# Patient Record
Sex: Female | Born: 2000 | Race: Black or African American | Hispanic: No | Marital: Single | Smoking: Never smoker
Health system: Southern US, Community
[De-identification: ages and names within clinical notes are randomized; demographics above are authoritative.]

## PROBLEM LIST (undated history)

## (undated) ENCOUNTER — Ambulatory Visit (HOSPITAL_COMMUNITY): Admission: EM

## (undated) DIAGNOSIS — K5900 Constipation, unspecified: Secondary | ICD-10-CM

## (undated) DIAGNOSIS — Z789 Other specified health status: Secondary | ICD-10-CM

## (undated) HISTORY — PX: HERNIA REPAIR: SHX51

## (undated) HISTORY — DX: Other specified health status: Z78.9

## (undated) HISTORY — DX: Constipation, unspecified: K59.00

---
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
## 2019-03-11 ENCOUNTER — Encounter (HOSPITAL_COMMUNITY): Payer: Self-pay | Admitting: *Deleted

## 2019-03-11 ENCOUNTER — Other Ambulatory Visit: Payer: Self-pay

## 2019-03-11 ENCOUNTER — Emergency Department (HOSPITAL_COMMUNITY)
Admission: EM | Admit: 2019-03-11 | Discharge: 2019-03-11 | Disposition: A | Discharge status: Home / Self Care | Payer: Self-pay | Attending: Emergency Medicine | Admitting: Emergency Medicine

## 2019-03-11 DIAGNOSIS — K0889 Other specified disorders of teeth and supporting structures: Secondary | ICD-10-CM | POA: Insufficient documentation

## 2019-03-11 MED ORDER — PENICILLIN V POTASSIUM 500 MG PO TABS: 1000.0000 mg | ORAL_TABLET | Freq: Two times a day (BID) | ORAL | 0 refills | Status: DC

## 2019-03-11 MED ORDER — HYDROCODONE-ACETAMINOPHEN 5-325 MG PO TABS
2.0000 | ORAL_TABLET | Freq: Once | ORAL | Status: AC
  Administered 2019-03-11: 03:00:00 2 via ORAL
  Filled 2019-03-11: qty 2

## 2019-03-11 MED ORDER — PENICILLIN V POTASSIUM 500 MG PO TABS
500.0000 mg | ORAL_TABLET | Freq: Once | ORAL | Status: AC
  Administered 2019-03-11: 500 mg via ORAL
  Filled 2019-03-11: qty 1

## 2019-03-11 NOTE — ED Provider Notes (Signed)
[facility] DEPT Provider Note   CSN: [id number] Arrival date & time: 03/11/19  0058     History   Chief Complaint Chief Complaint  Patient presents with  . Dental Pain    HPI Natasha Cooper is a 18 y.o. female with a hx of no major medical problems presents to the Emergency Department complaining of gradual, persistent, progressively worsening right upper dental pain onset this evening around 10 PM.  Patient reports her right upper molar did have a filling however it came out about 2 months ago.  She reports some intermittent pain to the site over the last month however it became constant and intense tonight.  She denies fevers or chills, headache, neck pain, neck stiffness, nausea or vomiting.  Patient denies obvious dental abscess, difficulty swallowing, changes in voice.  No treatments prior to arrival.  Nothing specific seems to make the symptoms better or worse.  Patient reports she has not seen a dentist in many years.     The history is provided by the patient and medical records. No language interpreter was used.    Past Medical History:  Diagnosis Date  . Constipation     Patient Active Problem List   Diagnosis Date Noted  . Constipation 03/02/2013  . Vision changes 03/02/2013    Past Surgical History:  Procedure Laterality Date  . HERNIA REPAIR       OB History   No obstetric history on file.      Home Medications    Prior to Admission medications   Medication Sig Start Date End Date Taking? Authorizing Provider  HYDROcodone-acetaminophen (HYCET) 7.5-325 mg/15 ml solution Take 10 mLs by mouth every 8 (eight) hours as needed for severe pain. 07/16/15   Antonietta Breach, PA-C  ibuprofen (CHILDRENS IBUPROFEN) 100 MG/5ML suspension Take 20 mLs (400 mg total) by mouth every 6 (six) hours as needed for mild pain or moderate pain. 07/16/15   Antonietta Breach, PA-C  ondansetron (ZOFRAN ODT) 4 MG disintegrating tablet Take 1 tablet (4 mg  total) by mouth every 8 (eight) hours as needed for nausea or vomiting. 02/16/17   Ward, Ozella Almond, PA-C  penicillin v potassium (VEETID) 500 MG tablet Take 2 tablets (1,000 mg total) by mouth 2 (two) times daily. X 7 days 03/11/19   Audrey Eller, Jarrett Soho, PA-C    Family History Family History  Problem Relation Age of Onset  . Multiple sclerosis Father   . Asthma Brother   . Cancer Paternal Grandmother   . Arthritis Maternal Grandmother   . Diabetes Maternal Grandmother     Social History Social History   Tobacco Use  . Smoking status: Never Smoker  . Smokeless tobacco: Never Used  Substance Use Topics  . Alcohol use: No    Alcohol/week: 0.0 standard drinks  . Drug use: No     Allergies   Patient has no known allergies.   Review of Systems Review of Systems  Constitutional: Negative for appetite change, chills and fever.  HENT: Positive for dental problem. Negative for drooling, ear pain, facial swelling, nosebleeds, postnasal drip, rhinorrhea and trouble swallowing.   Eyes: Negative for pain and redness.  Respiratory: Negative for cough and wheezing.   Cardiovascular: Negative for chest pain.  Gastrointestinal: Negative for abdominal pain, nausea and vomiting.  Musculoskeletal: Negative for neck pain and neck stiffness.  Skin: Negative for color change and rash.  Neurological: Negative for weakness, light-headedness and headaches.  All other systems reviewed and are negative.  Physical Exam Updated Vital Signs BP 123/88 (BP Location: Right Arm)   Pulse 81   Temp 98.7 F (37.1 C) (Oral)   Resp 18   Ht 5\' 4"  (1.626 m)   Wt 70.3 kg   LMP 03/09/2019 (Exact Date)   SpO2 100%   BMI 26.61 kg/m   Physical Exam Vitals signs and nursing note reviewed.  Constitutional:      Appearance: She is well-developed.  HENT:     Head: Normocephalic.     Right Ear: Tympanic membrane, ear canal and external ear normal.     Left Ear: Tympanic membrane, ear canal and  external ear normal.     Nose: Nose normal.     Right Sinus: No maxillary sinus tenderness or frontal sinus tenderness.     Left Sinus: No maxillary sinus tenderness or frontal sinus tenderness.     Mouth/Throat:     Mouth: No lacerations or oral lesions.     Dentition: Abnormal dentition. Dental caries present.     Pharynx: Uvula midline. No oropharyngeal exudate, posterior oropharyngeal erythema or uvula swelling.     Tonsils: No tonsillar abscesses.      Comments: Sublingual tissue without induration or pain. Eyes:     General:        Right eye: No discharge.        Left eye: No discharge.     Conjunctiva/sclera: Conjunctivae normal.  Neck:     Musculoskeletal: Normal range of motion and neck supple.     Comments: No stridor Handling secretions without difficulty No nuchal rigidity No cervical lymphadenopathy Cardiovascular:     Rate and Rhythm: Normal rate and regular rhythm.  Pulmonary:     Effort: Pulmonary effort is normal. No respiratory distress.  Abdominal:     General: There is no distension.  Musculoskeletal: Normal range of motion.  Lymphadenopathy:     Cervical: No cervical adenopathy.  Skin:    General: Skin is warm and dry.  Neurological:     Mental Status: She is alert.  Psychiatric:        Mood and Affect: Mood normal.      ED Treatments / Results   Procedures Procedures (including critical care time)  Medications Ordered in ED Medications  HYDROcodone-acetaminophen (NORCO/VICODIN) 5-325 MG per tablet 2 tablet (has no administration in time range)  penicillin v potassium (VEETID) tablet 500 mg (has no administration in time range)     Initial Impression / Assessment and Plan / ED Course  I have reviewed the triage vital signs and the nursing notes.  Pertinent labs & imaging results that were available during my care of the patient were reviewed by me and considered in my medical decision making (see chart for details).        Patient  with toothache.  No gross abscess.  Exam unconcerning for Ludwig's angina or spread of infection.  Will treat with penicillin and anti-inflammatories medicine.  Urged patient to follow-up with dentist.     Final Clinical Impressions(s) / ED Diagnoses   Final diagnoses:  Pain, dental    ED Discharge Orders         Ordered    penicillin v potassium (VEETID) 500 MG tablet  2 times daily,   Status:  Discontinued     03/11/19 0259    penicillin v potassium (VEETID) 500 MG tablet  2 times daily     03/11/19 0302           Maryruth Apple, Dahlia Client,  PA-C 03/11/19 0302    Nira Connardama, Pedro Eduardo, MD 03/11/19 [phone]

## 2019-03-11 NOTE — Discharge Instructions (Addendum)
1. Medications: Alternate Tylenol and ibuprofen (You may take 1000 mg of Tylenol up to 4 times per day, do not exceed 4 g in 24 hours.  You may take 800 mg of ibuprofen up to 3 times per day.) penicillin, usual home medications 2. Treatment: rest, drink plenty of fluids, take medications as prescribed 3. Follow Up: Please followup with dentistry within 1 week for discussion of your diagnoses and further evaluation after today's visit; if you do not have a primary care doctor use the resource guide provided to find one; Return to the ER for high fevers, difficulty breathing, difficulty swallowing or other concerning symptoms

## 2019-03-11 NOTE — ED Triage Notes (Addendum)
Pt reports right upper dental pain x 3-4 weeks.  Pt reports the pain got worse tonight.

## 2020-01-26 ENCOUNTER — Emergency Department (HOSPITAL_COMMUNITY)
Admission: EM | Admit: 2020-01-26 | Discharge: 2020-01-26 | Disposition: A | Discharge status: Home / Self Care | Payer: BC Managed Care – PPO | Attending: Emergency Medicine | Admitting: Emergency Medicine

## 2020-01-26 ENCOUNTER — Encounter (HOSPITAL_COMMUNITY): Payer: Self-pay | Admitting: Emergency Medicine

## 2020-01-26 DIAGNOSIS — R519 Headache, unspecified: Secondary | ICD-10-CM | POA: Diagnosis not present

## 2020-01-26 DIAGNOSIS — K0889 Other specified disorders of teeth and supporting structures: Secondary | ICD-10-CM | POA: Diagnosis present

## 2020-01-26 DIAGNOSIS — K0381 Cracked tooth: Secondary | ICD-10-CM | POA: Insufficient documentation

## 2020-01-26 MED ORDER — IBUPROFEN 800 MG PO TABS: 800.0000 mg | ORAL_TABLET | Freq: Three times a day (TID) | ORAL | 0 refills | Status: AC

## 2020-01-26 MED ORDER — PENICILLIN V POTASSIUM 500 MG PO TABS: 500.0000 mg | ORAL_TABLET | Freq: Four times a day (QID) | ORAL | 0 refills | Status: AC

## 2020-01-26 MED ORDER — CHLORHEXIDINE GLUCONATE 0.12 % MT SOLN: 15.0000 mL | Freq: Two times a day (BID) | OROMUCOSAL | 0 refills | Status: AC

## 2020-01-26 NOTE — ED Provider Notes (Signed)
[facility] EMERGENCY DEPARTMENT Provider Note   CSN: [id number] Arrival date & time: 01/26/20  0550     History Chief Complaint  Patient presents with   Dental Pain    Natasha Cooper is a 19 y.o. female here with dental pain.  Thinks she cracked her bottom right molar yesterday.  She's had throbbing pain all night.  No fevers or chills.  She has a Education officer, community but has not called yet today.  She reports a throbbing headache with this.  HPI     Past Medical History:  Diagnosis Date   Constipation     Patient Active Problem List   Diagnosis Date Noted   Constipation 03/02/2013   Vision changes 03/02/2013    Past Surgical History:  Procedure Laterality Date   HERNIA REPAIR       OB History   No obstetric history on file.     Family History  Problem Relation Age of Onset   Multiple sclerosis Father    Asthma Brother    Cancer Paternal Grandmother    Arthritis Maternal Grandmother    Diabetes Maternal Grandmother     Social History   Tobacco Use   Smoking status: Never Smoker   Smokeless tobacco: Never Used  Building services engineer Use: Never used  Substance Use Topics   Alcohol use: No    Alcohol/week: 0.0 standard drinks   Drug use: No    Home Medications Prior to Admission medications   Medication Sig Start Date End Date Taking? Authorizing Provider  chlorhexidine (PERIDEX) 0.12 % solution Use as directed 15 mLs in the mouth or throat 2 (two) times daily for 7 days. 01/26/20 02/02/20  Terald Sleeper, MD  HYDROcodone-acetaminophen (HYCET) 7.5-325 mg/15 ml solution Take 10 mLs by mouth every 8 (eight) hours as needed for severe pain. 07/16/15   Antony Madura, PA-C  ibuprofen (ADVIL) 800 MG tablet Take 1 tablet (800 mg total) by mouth 3 (three) times daily for 7 days. 01/26/20 02/02/20  Terald Sleeper, MD  ibuprofen (CHILDRENS IBUPROFEN) 100 MG/5ML suspension Take 20 mLs (400 mg total) by mouth every 6 (six) hours as  needed for mild pain or moderate pain. 07/16/15   Antony Madura, PA-C  ondansetron (ZOFRAN ODT) 4 MG disintegrating tablet Take 1 tablet (4 mg total) by mouth every 8 (eight) hours as needed for nausea or vomiting. 02/16/17   Ward, Chase Picket, PA-C  penicillin v potassium (VEETID) 500 MG tablet Take 2 tablets (1,000 mg total) by mouth 2 (two) times daily. X 7 days 03/11/19   Muthersbaugh, Dahlia Client, PA-C  penicillin v potassium (VEETID) 500 MG tablet Take 1 tablet (500 mg total) by mouth 4 (four) times daily for 7 days. 01/26/20 02/02/20  Terald Sleeper, MD    Allergies    Patient has no known allergies.  Review of Systems   Review of Systems  Constitutional: Negative for chills and fever.  HENT: Positive for dental problem. Negative for drooling.   Respiratory: Negative for cough and shortness of breath.   Cardiovascular: Negative for chest pain and palpitations.  Gastrointestinal: Negative for nausea and vomiting.  Musculoskeletal: Negative for arthralgias and back pain.  Skin: Negative for color change and rash.  Allergic/Immunologic: Negative for food allergies and immunocompromised state.  Neurological: Positive for headaches. Negative for syncope.  Psychiatric/Behavioral: Negative for agitation and confusion.  All other systems reviewed and are negative.   Physical Exam Updated Vital Signs BP 121/70 (BP Location:  Left Arm)    Pulse 96    Temp 99.1 F (37.3 C) (Oral)    Resp 16    SpO2 96%   Physical Exam Vitals and nursing note reviewed.  Constitutional:      General: She is not in acute distress.    Appearance: She is well-developed.  HENT:     Head: Normocephalic and atraumatic.     Comments: Cracked enamel tooth 32, no fluctuance or sign of abscess Eyes:     Conjunctiva/sclera: Conjunctivae normal.  Cardiovascular:     Rate and Rhythm: Normal rate and regular rhythm.     Pulses: Normal pulses.  Pulmonary:     Effort: Pulmonary effort is normal. No respiratory distress.    Musculoskeletal:     Cervical back: Neck supple.  Skin:    General: Skin is warm and dry.  Neurological:     Mental Status: She is alert.  Psychiatric:        Mood and Affect: Mood normal.        Behavior: Behavior normal.     ED Results / Procedures / Treatments   Labs (all labs ordered are listed, but only abnormal results are displayed) Labs Reviewed - No data to display  EKG None  Radiology No results found.  Procedures Procedures (including critical care time)  Medications Ordered in ED Medications - No data to display  ED Course  I have reviewed the triage vital signs and the nursing notes.  Pertinent labs & imaging results that were available during my care of the patient were reviewed by me and considered in my medical decision making (see chart for details).  19 yo female presenting with cracked tooth, bottom right molar, #32.  No evidence of abscess on exam.  She is well appearing.  Suspect her headache is related to this tooth ache.    We'll give penicillin to try to prevent pulpitis or further infection, peridex swish & spit, motrin, f/u with dental clinic.     Final Clinical Impression(s) / ED Diagnoses Final diagnoses:  Pain, dental  Cracked tooth    Rx / DC Orders ED Discharge Orders         Ordered    ibuprofen (ADVIL) 800 MG tablet  3 times daily     Discontinue  Reprint     01/26/20 0818    penicillin v potassium (VEETID) 500 MG tablet  4 times daily     Discontinue  Reprint     01/26/20 0818    chlorhexidine (PERIDEX) 0.12 % solution  2 times daily     Discontinue  Reprint     01/26/20 0818           Terald Sleeper, MD 01/26/20 1022

## 2020-01-26 NOTE — ED Triage Notes (Signed)
Pt endorses R lower dental pain that began yesterday. Also endorses headache, taking ibuprofen and tylenol without relief.

## 2020-01-26 NOTE — ED Notes (Signed)
Pt discharge instructions reviewed with the patient. The patient verbalized understanding of instructions. Pt discharged. 

## 2020-01-26 NOTE — Discharge Instructions (Addendum)
Call this number today for Dr Michaelle Copas office and tell them you were seen in the [facility] ER today.  Ask if they can see you in the office today for a cracked molar tooth.

## 2022-03-30 LAB — OB RESULTS CONSOLE HEPATITIS B SURFACE ANTIGEN
Hepatitis B Surface Ag: NEGATIVE
Hepatitis B Surface Ag: NEGATIVE

## 2022-03-30 LAB — OB RESULTS CONSOLE ANTIBODY SCREEN: Antibody Screen: NEGATIVE

## 2022-03-30 LAB — OB RESULTS CONSOLE ABO/RH: RH Type: POSITIVE

## 2022-03-30 LAB — OB RESULTS CONSOLE RPR
RPR: NONREACTIVE
RPR: NONREACTIVE

## 2022-03-30 LAB — OB RESULTS CONSOLE HIV ANTIBODY (ROUTINE TESTING)
HIV: NONREACTIVE
HIV: NONREACTIVE

## 2022-03-30 LAB — OB RESULTS CONSOLE RUBELLA ANTIBODY, IGM
Rubella: IMMUNE
Rubella: IMMUNE

## 2022-03-30 LAB — HEPATITIS C ANTIBODY: HCV Ab: NEGATIVE

## 2022-03-30 LAB — OB RESULTS CONSOLE GC/CHLAMYDIA
Chlamydia: NEGATIVE
Neisseria Gonorrhea: NEGATIVE

## 2022-05-12 ENCOUNTER — Other Ambulatory Visit: Payer: Self-pay | Admitting: Obstetrics

## 2022-05-12 ENCOUNTER — Other Ambulatory Visit: Payer: Self-pay

## 2022-05-12 DIAGNOSIS — Z363 Encounter for antenatal screening for malformations: Secondary | ICD-10-CM

## 2022-05-13 ENCOUNTER — Ambulatory Visit: Payer: BC Managed Care – PPO | Attending: Obstetrics | Admitting: Obstetrics

## 2022-05-13 ENCOUNTER — Ambulatory Visit: Payer: BC Managed Care – PPO | Attending: Obstetrics and Gynecology

## 2022-05-13 ENCOUNTER — Encounter: Payer: Self-pay | Admitting: *Deleted

## 2022-05-13 ENCOUNTER — Other Ambulatory Visit: Payer: Self-pay | Admitting: *Deleted

## 2022-05-13 ENCOUNTER — Ambulatory Visit: Payer: BC Managed Care – PPO | Admitting: *Deleted

## 2022-05-13 VITALS — BP 101/57 | HR 75

## 2022-05-13 DIAGNOSIS — O358XX0 Maternal care for other (suspected) fetal abnormality and damage, not applicable or unspecified: Secondary | ICD-10-CM | POA: Diagnosis not present

## 2022-05-13 DIAGNOSIS — O365920 Maternal care for other known or suspected poor fetal growth, second trimester, not applicable or unspecified: Secondary | ICD-10-CM | POA: Insufficient documentation

## 2022-05-13 DIAGNOSIS — Z3A19 19 weeks gestation of pregnancy: Secondary | ICD-10-CM

## 2022-05-13 DIAGNOSIS — Z363 Encounter for antenatal screening for malformations: Secondary | ICD-10-CM | POA: Diagnosis not present

## 2022-05-13 DIAGNOSIS — Z3492 Encounter for supervision of normal pregnancy, unspecified, second trimester: Secondary | ICD-10-CM

## 2022-05-13 DIAGNOSIS — O365910 Maternal care for other known or suspected poor fetal growth, first trimester, not applicable or unspecified: Secondary | ICD-10-CM

## 2022-05-13 NOTE — Progress Notes (Signed)
MFM Note  Natasha Cooper was seen for a detailed fetal anatomy scan due to possible IUGR noted on an ultrasound performed in your office yesterday.  The patient reports that she had a first trimester ultrasound performed in your office that showed an [facility] of October 04, 2022.  She denies any significant past medical history and denies any problems in her current pregnancy.    She had a cell free DNA test earlier in her pregnancy which indicated a low risk for trisomy 37, 5, and 13. A female fetus is predicted.   Using an [facility] of October 04, 2022, the overall EFW obtained today with measures at the 11th percentile for her gestational age.  We changed her EDC to October 04, 2022 today.  There was normal amniotic fluid noted.  Doppler studies of the umbilical arteries performed today continues to show normal forward flow.  There were no signs of absent or reversed end-diastolic flow noted today.  On today's exam, an intracardiac echogenic focus was noted in the left ventricle of the fetal heart.    The small association between an echogenic focus and Down syndrome was discussed.   The patient was advised that the smaller sized fetus along with the echogenic focus noted today may increase her risk of Down syndrome.  She was offered and declined an amniocentesis today for definitive diagnosis of fetal aneuploidy.  She reports that she is comfortable with her negative cell free DNA test.  The patient was informed that anomalies may be missed due to technical limitations. If the fetus is in a suboptimal position or maternal habitus is increased, visualization of the fetus in the maternal uterus may be impaired.  A follow-up exam was scheduled in 3 weeks to assess the fetal growth.    The patient stated that all of her questions were answered today.  A total of 30 minutes was spent counseling and coordinating the care for this patient.  Greater than 50% of the time was spent in direct  face-to-face contact.

## 2022-06-01 ENCOUNTER — Other Ambulatory Visit: Payer: Self-pay | Admitting: *Deleted

## 2022-06-01 ENCOUNTER — Ambulatory Visit: Payer: BC Managed Care – PPO | Attending: Obstetrics

## 2022-06-01 ENCOUNTER — Ambulatory Visit: Payer: BC Managed Care – PPO | Admitting: *Deleted

## 2022-06-01 VITALS — BP 120/67 | HR 72

## 2022-06-01 DIAGNOSIS — Z3A22 22 weeks gestation of pregnancy: Secondary | ICD-10-CM | POA: Insufficient documentation

## 2022-06-01 DIAGNOSIS — O358XX0 Maternal care for other (suspected) fetal abnormality and damage, not applicable or unspecified: Secondary | ICD-10-CM | POA: Insufficient documentation

## 2022-06-01 DIAGNOSIS — Z3492 Encounter for supervision of normal pregnancy, unspecified, second trimester: Secondary | ICD-10-CM

## 2022-06-01 DIAGNOSIS — Z3689 Encounter for other specified antenatal screening: Secondary | ICD-10-CM | POA: Insufficient documentation

## 2022-06-01 DIAGNOSIS — O365920 Maternal care for other known or suspected poor fetal growth, second trimester, not applicable or unspecified: Secondary | ICD-10-CM

## 2022-06-01 DIAGNOSIS — Z362 Encounter for other antenatal screening follow-up: Secondary | ICD-10-CM | POA: Insufficient documentation

## 2022-06-22 ENCOUNTER — Ambulatory Visit: Payer: BC Managed Care – PPO | Attending: Obstetrics

## 2022-06-22 ENCOUNTER — Ambulatory Visit: Payer: BC Managed Care – PPO | Admitting: *Deleted

## 2022-06-22 ENCOUNTER — Telehealth: Payer: Self-pay

## 2022-06-22 ENCOUNTER — Other Ambulatory Visit: Payer: Self-pay | Admitting: *Deleted

## 2022-06-22 VITALS — BP 105/62 | HR 79

## 2022-06-22 DIAGNOSIS — O358XX0 Maternal care for other (suspected) fetal abnormality and damage, not applicable or unspecified: Secondary | ICD-10-CM | POA: Diagnosis not present

## 2022-06-22 DIAGNOSIS — Z3A25 25 weeks gestation of pregnancy: Secondary | ICD-10-CM | POA: Diagnosis not present

## 2022-06-22 DIAGNOSIS — O365920 Maternal care for other known or suspected poor fetal growth, second trimester, not applicable or unspecified: Secondary | ICD-10-CM

## 2022-06-22 DIAGNOSIS — O283 Abnormal ultrasonic finding on antenatal screening of mother: Secondary | ICD-10-CM

## 2022-06-23 ENCOUNTER — Other Ambulatory Visit: Payer: Self-pay | Admitting: *Deleted

## 2022-06-23 DIAGNOSIS — O283 Abnormal ultrasonic finding on antenatal screening of mother: Secondary | ICD-10-CM

## 2022-06-23 DIAGNOSIS — O365921 Maternal care for other known or suspected poor fetal growth, second trimester, fetus 1: Secondary | ICD-10-CM

## 2022-06-30 ENCOUNTER — Ambulatory Visit: Payer: BC Managed Care – PPO | Admitting: *Deleted

## 2022-06-30 ENCOUNTER — Ambulatory Visit: Payer: BC Managed Care – PPO | Attending: Maternal & Fetal Medicine

## 2022-06-30 VITALS — BP 110/63 | HR 82

## 2022-06-30 DIAGNOSIS — O365920 Maternal care for other known or suspected poor fetal growth, second trimester, not applicable or unspecified: Secondary | ICD-10-CM | POA: Diagnosis not present

## 2022-06-30 DIAGNOSIS — Z3A26 26 weeks gestation of pregnancy: Secondary | ICD-10-CM

## 2022-06-30 DIAGNOSIS — O365929 Maternal care for other known or suspected poor fetal growth, second trimester, other fetus: Secondary | ICD-10-CM | POA: Insufficient documentation

## 2022-06-30 DIAGNOSIS — O35BXX0 Maternal care for other (suspected) fetal abnormality and damage, fetal cardiac anomalies, not applicable or unspecified: Secondary | ICD-10-CM

## 2022-06-30 DIAGNOSIS — O283 Abnormal ultrasonic finding on antenatal screening of mother: Secondary | ICD-10-CM | POA: Diagnosis present

## 2022-07-09 ENCOUNTER — Ambulatory Visit: Payer: BC Managed Care – PPO | Admitting: *Deleted

## 2022-07-09 ENCOUNTER — Ambulatory Visit: Payer: BC Managed Care – PPO | Attending: Maternal & Fetal Medicine

## 2022-07-09 VITALS — BP 117/58 | HR 69

## 2022-07-09 DIAGNOSIS — O358XX0 Maternal care for other (suspected) fetal abnormality and damage, not applicable or unspecified: Secondary | ICD-10-CM

## 2022-07-09 DIAGNOSIS — O365920 Maternal care for other known or suspected poor fetal growth, second trimester, not applicable or unspecified: Secondary | ICD-10-CM | POA: Diagnosis not present

## 2022-07-09 DIAGNOSIS — O283 Abnormal ultrasonic finding on antenatal screening of mother: Secondary | ICD-10-CM

## 2022-07-09 DIAGNOSIS — Z3A27 27 weeks gestation of pregnancy: Secondary | ICD-10-CM

## 2022-07-13 NOTE — L&D Delivery Note (Signed)
Delivery Note At 1:39 AM a viable and healthy female was delivered via Vaginal, Spontaneous (Presentation: Left Occiput Anterior).  APGAR: 8, 9; weight pending .   Placenta status: Spontaneous;Pathology, Intact.  Cord: 3 vessels   The patient pushed for approximately 15 minutes and delivered a vigorous female infant in the vertex LOA presentation with Apgar scores of 8 at 1 minute and 9 at 5 minutes.  Following the delivery the infant was passed to the maternal abdomen, bulb suctioned, and stimulated.  Following a 1 minute delay, the cord was clamped and cut.  The placenta was then spontaneously delivered.  A second-degree perineal laceration was repaired with 3-0 Vicryl.  All sponge, instrument, needle counts were correct.  EBL 124 cc.  Mother and baby doing well following delivery.  Anesthesia: Epidural Episiotomy: None Lacerations: 2nd degree Suture Repair: 3.0 vicryl Est. Blood Loss (mL): 124  Mom to postpartum.  Baby to Couplet care / Skin to Skin.  Vanessa Kick 09/11/2022, 2:04 AM

## 2022-07-15 ENCOUNTER — Ambulatory Visit: Payer: BC Managed Care – PPO | Attending: Maternal & Fetal Medicine

## 2022-07-15 ENCOUNTER — Encounter: Payer: Self-pay | Admitting: *Deleted

## 2022-07-15 ENCOUNTER — Ambulatory Visit: Payer: BC Managed Care – PPO | Admitting: *Deleted

## 2022-07-15 ENCOUNTER — Ambulatory Visit: Payer: BC Managed Care – PPO

## 2022-07-15 DIAGNOSIS — O365930 Maternal care for other known or suspected poor fetal growth, third trimester, not applicable or unspecified: Secondary | ICD-10-CM

## 2022-07-15 DIAGNOSIS — O283 Abnormal ultrasonic finding on antenatal screening of mother: Secondary | ICD-10-CM | POA: Diagnosis present

## 2022-07-15 DIAGNOSIS — O365921 Maternal care for other known or suspected poor fetal growth, second trimester, fetus 1: Secondary | ICD-10-CM | POA: Diagnosis present

## 2022-07-15 DIAGNOSIS — Z3A28 28 weeks gestation of pregnancy: Secondary | ICD-10-CM

## 2022-07-20 ENCOUNTER — Ambulatory Visit: Payer: BC Managed Care – PPO

## 2022-07-27 ENCOUNTER — Ambulatory Visit: Payer: BC Managed Care – PPO

## 2022-08-03 ENCOUNTER — Ambulatory Visit: Payer: BC Managed Care – PPO | Admitting: *Deleted

## 2022-08-03 ENCOUNTER — Other Ambulatory Visit: Payer: Self-pay | Admitting: *Deleted

## 2022-08-03 ENCOUNTER — Other Ambulatory Visit: Payer: Self-pay | Admitting: Maternal & Fetal Medicine

## 2022-08-03 ENCOUNTER — Ambulatory Visit: Payer: BC Managed Care – PPO | Attending: Maternal & Fetal Medicine

## 2022-08-03 VITALS — BP 115/67 | HR 75

## 2022-08-03 DIAGNOSIS — O283 Abnormal ultrasonic finding on antenatal screening of mother: Secondary | ICD-10-CM | POA: Insufficient documentation

## 2022-08-03 DIAGNOSIS — O365920 Maternal care for other known or suspected poor fetal growth, second trimester, not applicable or unspecified: Secondary | ICD-10-CM | POA: Insufficient documentation

## 2022-08-03 DIAGNOSIS — O365930 Maternal care for other known or suspected poor fetal growth, third trimester, not applicable or unspecified: Secondary | ICD-10-CM | POA: Diagnosis not present

## 2022-08-03 DIAGNOSIS — Z3A31 31 weeks gestation of pregnancy: Secondary | ICD-10-CM | POA: Diagnosis not present

## 2022-08-03 NOTE — Procedures (Cosign Needed)
Natasha Cooper 2001-06-21 [redacted]w[redacted]d  Fetus A Non-Stress Test Interpretation for 08/03/22  Indication: IUGR  Fetal Heart Rate A Mode: External Baseline Rate (A): 140 bpm Variability: Moderate Accelerations: 10 x 10 Decelerations: None Multiple birth?: No  Uterine Activity Mode: Palpation, Toco Contraction Frequency (min): none Resting Tone Palpated: Relaxed  Interpretation (Fetal Testing) Nonstress Test Interpretation: Reactive Overall Impression: Reassuring for gestational age Comments: Dr. Epimenio Sarin reviewed tracing.

## 2022-08-10 ENCOUNTER — Ambulatory Visit: Payer: BC Managed Care – PPO | Attending: Maternal & Fetal Medicine

## 2022-08-10 ENCOUNTER — Ambulatory Visit: Payer: BC Managed Care – PPO | Admitting: *Deleted

## 2022-08-10 ENCOUNTER — Other Ambulatory Visit: Payer: Self-pay | Admitting: *Deleted

## 2022-08-10 VITALS — BP 129/64 | HR 83

## 2022-08-10 DIAGNOSIS — O365930 Maternal care for other known or suspected poor fetal growth, third trimester, not applicable or unspecified: Secondary | ICD-10-CM

## 2022-08-10 DIAGNOSIS — Z3A32 32 weeks gestation of pregnancy: Secondary | ICD-10-CM | POA: Diagnosis not present

## 2022-08-17 ENCOUNTER — Ambulatory Visit: Payer: BC Managed Care – PPO | Attending: Maternal & Fetal Medicine

## 2022-08-17 ENCOUNTER — Ambulatory Visit: Payer: BC Managed Care – PPO

## 2022-08-24 ENCOUNTER — Observation Stay (HOSPITAL_COMMUNITY): Payer: BC Managed Care – PPO

## 2022-08-24 ENCOUNTER — Observation Stay (HOSPITAL_BASED_OUTPATIENT_CLINIC_OR_DEPARTMENT_OTHER): Payer: BC Managed Care – PPO

## 2022-08-24 ENCOUNTER — Ambulatory Visit: Payer: BC Managed Care – PPO | Admitting: *Deleted

## 2022-08-24 ENCOUNTER — Encounter (HOSPITAL_COMMUNITY): Payer: Self-pay | Admitting: Obstetrics and Gynecology

## 2022-08-24 ENCOUNTER — Observation Stay (HOSPITAL_COMMUNITY)
Admission: AD | Admit: 2022-08-24 | Discharge: 2022-08-25 | Disposition: A | Discharge status: Home / Self Care | Payer: BC Managed Care – PPO | Attending: Obstetrics and Gynecology | Admitting: Obstetrics and Gynecology

## 2022-08-24 ENCOUNTER — Ambulatory Visit (HOSPITAL_BASED_OUTPATIENT_CLINIC_OR_DEPARTMENT_OTHER): Payer: BC Managed Care – PPO

## 2022-08-24 ENCOUNTER — Other Ambulatory Visit: Payer: Self-pay

## 2022-08-24 VITALS — BP 116/68 | HR 85

## 2022-08-24 DIAGNOSIS — Z3A34 34 weeks gestation of pregnancy: Secondary | ICD-10-CM | POA: Insufficient documentation

## 2022-08-24 DIAGNOSIS — O365930 Maternal care for other known or suspected poor fetal growth, third trimester, not applicable or unspecified: Secondary | ICD-10-CM

## 2022-08-24 DIAGNOSIS — O368130 Decreased fetal movements, third trimester, not applicable or unspecified: Principal | ICD-10-CM | POA: Insufficient documentation

## 2022-08-24 DIAGNOSIS — O368190 Decreased fetal movements, unspecified trimester, not applicable or unspecified: Secondary | ICD-10-CM | POA: Diagnosis present

## 2022-08-24 DIAGNOSIS — Z3689 Encounter for other specified antenatal screening: Secondary | ICD-10-CM

## 2022-08-24 LAB — URINALYSIS, ROUTINE W REFLEX MICROSCOPIC
Bilirubin Urine: NEGATIVE
Glucose, UA: NEGATIVE mg/dL
Hgb urine dipstick: NEGATIVE
Ketones, ur: NEGATIVE mg/dL
Leukocytes,Ua: NEGATIVE
Nitrite: NEGATIVE
Protein, ur: NEGATIVE mg/dL
Specific Gravity, Urine: 1.016 (ref 1.005–1.030)
pH: 5 (ref 5.0–8.0)

## 2022-08-24 LAB — CBC
HCT: 29.3 % — ABNORMAL LOW (ref 36.0–46.0)
Hemoglobin: 10.1 g/dL — ABNORMAL LOW (ref 12.0–15.0)
MCH: 30.7 pg (ref 26.0–34.0)
MCHC: 34.5 g/dL (ref 30.0–36.0)
MCV: 89.1 fL (ref 80.0–100.0)
Platelets: 247 10*3/uL (ref 150–400)
RBC: 3.29 MIL/uL — ABNORMAL LOW (ref 3.87–5.11)
RDW: 13.3 % (ref 11.5–15.5)
WBC: 8.2 10*3/uL (ref 4.0–10.5)
nRBC: 0 % (ref 0.0–0.2)

## 2022-08-24 LAB — TYPE AND SCREEN
ABO/RH(D): O POS
Antibody Screen: NEGATIVE

## 2022-08-24 MED ORDER — DOCUSATE SODIUM 100 MG PO CAPS
100.0000 mg | ORAL_CAPSULE | Freq: Every day | ORAL | Status: DC
  Administered 2022-08-25: 100 mg via ORAL
  Filled 2022-08-24: qty 1

## 2022-08-24 MED ORDER — CALCIUM CARBONATE ANTACID 500 MG PO CHEW: 2.0000 | CHEWABLE_TABLET | ORAL | Status: DC | PRN

## 2022-08-24 MED ORDER — ACETAMINOPHEN 325 MG PO TABS: 650.0000 mg | ORAL_TABLET | ORAL | Status: DC | PRN

## 2022-08-24 NOTE — MAU Provider Note (Signed)
History     CSN: [id number]  Arrival date and time: 08/24/22 0951   None     Chief Complaint  Patient presents with   Decreased Fetal Movement   Abdominal Pain   HPI Natasha Cooper is a 22 y.o. G1P0 at 49w1dwho presents to MAU for extended monitoring. Patient was seen at MFM this morning and BPP was noted to be 4/8 with normal dopplers. She reports she has had decreased fetal movement since Saturday however today she reports she has not felt any fetal movement. She reports she has tried eating and drinking plenty of watery and sugary drinks without any change.   She endorses constant lower abdominal cramping for the past month. She denies leaking fluid, vaginal bleeding, concerning discharge, itching, odor, or urinary s/s. She does reports some mild constipation, last BM was on Saturday, however she reports this is normal outside of pregnancy.   [facility] has been significant for fetal growth restriction, last EFW was 1914g, 5%.   OB History     Gravida  1   Para      Term      Preterm      AB      Living         SAB      IAB      Ectopic      Multiple      Live Births              Past Medical History:  Diagnosis Date   Constipation    Medical history non-contributory     Past Surgical History:  Procedure Laterality Date   HERNIA REPAIR      Family History  Problem Relation Age of Onset   Multiple sclerosis Father    Asthma Brother    Cancer Paternal Grandmother    Arthritis Maternal Grandmother    Diabetes Maternal Grandmother     Social History   Tobacco Use   Smoking status: Never   Smokeless tobacco: Never  Vaping Use   Vaping Use: Never used  Substance Use Topics   Alcohol use: No    Alcohol/week: 0.0 standard drinks of alcohol   Drug use: No    Allergies: No Known Allergies  Medications Prior to Admission  Medication Sig Dispense Refill Last Dose   aspirin EC 81 MG tablet  Take 81 mg by mouth daily. Swallow whole.   08/23/2022   Prenatal Vit-Fe Fumarate-FA (PRENATAL MULTIVITAMIN) TABS tablet Take 1 tablet by mouth daily at 12 noon.   08/23/2022   Review of Systems  Constitutional: Negative.   Gastrointestinal:  Positive for abdominal pain and constipation.  Genitourinary: Negative.   Neurological: Negative.   All other systems reviewed and are negative.  Physical Exam   Blood pressure 107/70, pulse 95, temperature 98.2 F (36.8 C), temperature source Oral, resp. rate 17, height 5' 4"$  (1.626 m), weight 78.9 kg, last menstrual period 12/22/2021.  Physical Exam Vitals and nursing note reviewed.  Constitutional:      General: She is not in acute distress. Cardiovascular:     Rate and Rhythm: Normal rate.  Pulmonary:     Effort: Pulmonary effort is normal.  Abdominal:     Palpations: Abdomen is soft.     Tenderness: There is no abdominal tenderness.     Comments: Gravid    Skin:    General: Skin is warm and dry.  Neurological:  General: No focal deficit present.     Mental Status: She is alert and oriented to person, place, and time.  Psychiatric:        Mood and Affect: Affect is flat.        Behavior: Behavior normal.    Results for orders placed or performed during the hospital encounter of 08/24/22 (from the past 24 hour(s))  Urinalysis, Routine w reflex microscopic -Urine, Clean Catch     Status: None   Collection Time: 08/24/22 10:35 AM  Result Value Ref Range   Color, Urine YELLOW YELLOW   APPearance CLEAR CLEAR   Specific Gravity, Urine 1.016 1.005 - 1.030   pH 5.0 5.0 - 8.0   Glucose, UA NEGATIVE NEGATIVE mg/dL   Hgb urine dipstick NEGATIVE NEGATIVE   Bilirubin Urine NEGATIVE NEGATIVE   Ketones, ur NEGATIVE NEGATIVE mg/dL   Protein, ur NEGATIVE NEGATIVE mg/dL   Nitrite NEGATIVE NEGATIVE   Leukocytes,Ua NEGATIVE NEGATIVE    NST FHR: 130 bpm, moderate variability, +15x15 accels, no decels Toco: ui  MAU Course   Procedures  MDM UA Extended fetal monitoring  NST reactive and reassuring. After 2 hours of monitoring, patient reporting no fetal movement. Dr. Nehemiah Settle discussed patient with Dr. Epimenio Sarin - will admit to [facility] and repeat BPP this afternoon. Dr. Brien Mates notified of patient and to place admission orders.  Assessment and Plan   1. [redacted] weeks gestation of pregnancy   2. Decreased fetal movement affecting management of pregnancy in third trimester, single or unspecified fetus   3. Poor fetal growth affecting management of mother in third trimester, single or unspecified fetus   4. NST (non-stress test) reactive    - Admit to [facility] - Dr. Brien Mates notified and to place orders  Renee Harder, CNM 08/24/2022, 12:07 PM

## 2022-08-24 NOTE — MAU Note (Signed)
Natasha Cooper is a 22 y.o. at 26w1dhere in MAU reporting: DFM since Saturday. States there have been some movements but not as much as normal, and then this morning when she woke up at 0700 there has been no movement at all. Denies any LOF or VB. States she has lower mid abdominal pain that started a couple of weeks ago that is constant. No burning or frequency with urination. States she struggles with constipation, does not take any medication for that.   Onset of complaint: Saturday  Pain score: 6 Vitals:   08/24/22 1006  BP: 112/71  Pulse: 92  Resp: 17  Temp: 98.2 F (36.8 C)     FHT:130 Lab orders placed from triage:  n/a

## 2022-08-24 NOTE — Progress Notes (Signed)
Repeat BPP 8/8, reactive tracing = 10/10. Spoke with Dr. Carlis Abbott - can stop continuous monitoring, NST this evening and repeat BPP in AM. If reassuring will d/c home. If any nonreassuring testing, recommends steroid course.   Luther Redo, MD 08/24/22 5:44 PM

## 2022-08-24 NOTE — H&P (Signed)
Natasha Cooper is a 22 y.o. female G1P0 36w1dwith FGR sent from MFM scan for observation.   Pt seen this morning for UA dopplers and growth at MFM. She noted she had not felt fetal movement today. Growth UKoreashowed EFW 5%ile (1914g = 4lb 4oz) with normal UA dopplers, but BPP 4/8 (-2 breathing and -2 only 2 gross movements). She was sent to MAU for extended NST which was reactive. Plan per Dr. SCarlis Abbottis to repeat BPP this afternoon.   At this time, patient states she has been feeling more movements. No contractions, LOF or VB.     OB History     Gravida  1   Para      Term      Preterm      AB      Living         SAB      IAB      Ectopic      Multiple      Live Births             Past Medical History:  Diagnosis Date   Constipation    Medical history non-contributory    Past Surgical History:  Procedure Laterality Date   HERNIA REPAIR     Family History: family history includes Arthritis in her maternal grandmother; Asthma in her brother; Cancer in her paternal grandmother; Diabetes in her maternal grandmother; Multiple sclerosis in her father. Social History:  reports that she has never smoked. She has never used smokeless tobacco. She reports that she does not drink alcohol and does not use drugs.     Maternal Diabetes: No Genetic Screening: Normal Maternal Ultrasounds/Referrals: IUGR Fetal Ultrasounds or other Referrals:  Referred to Materal Fetal Medicine  Maternal Substance Abuse:  No Significant Maternal Medications:  None Significant Maternal Lab Results:  None Other Comments:  None  Review of Systems Per HPI Exam Physical Exam    Blood pressure 106/69, pulse 79, temperature 98.3 F (36.8 C), temperature source Oral, resp. rate 15, height 5' 4"$  (1.626 m), weight 78.9 kg, last menstrual period 12/22/2021, SpO2 99 %. Gen: NAD, resting comfortably CVS: normal pulses Lungs: nonlabored respirations Abd: Gravid abdomen Ext: no calf  edema or tenderness  Fetal testing: 140bpm, mod variability, + accels, no decels Toco: irritability  Prenatal labs: ABO, Rh:  --/--/O POS (02/12 1206) Antibody: NEG (02/12 1206) Rubella: Immune (09/18 0000) RPR: Nonreactive (09/18 0000)  HBsAg: Negative (09/18 0000)  HIV: Non-reactive (09/18 0000)  GBS:     Assessment/Plan: 22Y G1P0 @ 343w1dobservation for BPP 6/10 and complaint of decreased fetal movement - Reassuring status on continuous EFM and patient reports increased movement this afternoon - MFM Dr. ScCarlis Abbottecommends to repeat BPP this afternoon and plan pending results. Would hold steroids for now.    MiRowland Lathe/06/2023, 2:44 PM

## 2022-08-24 NOTE — Progress Notes (Incomplete)
[facility]  Clinical Social Worker:  Laureen Ochs Date/Time:  08/24/2022, 3:01 PM Gestational Age on Admission:  22 y.o. Admitting Diagnosis:  ***   Expected Delivery Date:     Family/Home Environment  Home Address:  ***  Household Member/Support Name:  *** Relationship:    Other Support:  ***   Psychosocial Data  Employment:     Type of Work:    Education:     Nurse, mental health Care:  ***   Strengths/Weaknesses/Factors to Consider  Concerns Related to Hospitalization:  ***   Previous Pregnancies/Feelings Towards Pregnancy?  Concerns related to being/becoming a mother?:  ***  Social Support (FOB? Who is/will be helping with baby/other kids?):      Recent Stressful Life Events (life changes in past year?):  ***   Prenatal Care/Education/Home Preparations: ***   Domestic Violence (of any type):    If Yes to Domestic Violence, Describe/Action Plan:  ***   Substance Use During Pregnancy:      Patient Advised/Response:   ***   Other:   ***   Clinical Assessment/Plan:   ***

## 2022-08-25 ENCOUNTER — Observation Stay (HOSPITAL_BASED_OUTPATIENT_CLINIC_OR_DEPARTMENT_OTHER): Payer: BC Managed Care – PPO

## 2022-08-25 DIAGNOSIS — Z3A34 34 weeks gestation of pregnancy: Secondary | ICD-10-CM | POA: Diagnosis not present

## 2022-08-25 DIAGNOSIS — O365930 Maternal care for other known or suspected poor fetal growth, third trimester, not applicable or unspecified: Secondary | ICD-10-CM

## 2022-08-25 DIAGNOSIS — O368130 Decreased fetal movements, third trimester, not applicable or unspecified: Secondary | ICD-10-CM

## 2022-08-25 NOTE — Progress Notes (Addendum)
ANTEPARTUM PROGRESS NOTE  S: Patient endorses +FM, denies VB, LOF, CTX.   O: BP 101/61 (BP Location: Left Arm)   Pulse 80   Temp 98 F (36.7 C) (Oral)   Resp 18   Ht 5' 4"$  (1.626 m)   Wt 78.9 kg   LMP 12/22/2021   SpO2 100%   BMI 29.86 kg/m   Gen: In NAD, laying in bed CV: CTAB, RRR Abd: Gravid, S<D c/w known IUGR GU deferred MSK: neg calf TTP BL Psych/neuro: WNL  BPP 2/13: AFI 11, BPP 8/8 Category 1 tracing this AM prior to BPP  A/P: This is a 22yo G1 @ 34 2/7 with known FGR admitted overnight 2/2 DFM in setting of BPP 6/10. BPP 8/8 yesterday evning but, given initial presentation, kept until this AM for final assessment. BPP 8/8 this morning. No steroids administered this admission. Has f/u planned for Thursday of this week as well as another MFM scan on 2/19

## 2022-08-25 NOTE — Discharge Summary (Signed)
Physician Discharge Summary  Patient ID: Natasha Cooper MRN: [id number] DOB/AGE: [birth year] 24 y.o.  Admit date: 08/24/2022 Discharge date: 08/25/2022  Admission Diagnoses:  Discharge Diagnoses:  Principal Problem:   Decreased fetal movement affecting management of pregnancy   Discharged Condition: good  Hospital Course: Admitted for 24hrs obs for decreased fetal movement in setting of FGR with office BPP of 6/10. Repeat BPP 2/12 as well as this AM, 2/13, both 8/8 with reactive tracings. Stable for discharge home with close follow up; no BMTZ given, appropriate fetal movement noted by patient throughout admission   Consults:  MFM  Significant Diagnostic Studies: biophysical profile   Discharge Exam: Blood pressure 101/61, pulse 80, temperature 98 F (36.7 C), temperature source Oral, resp. rate 18, height 5' 4"$  (1.626 m), weight 78.9 kg, last menstrual period 12/22/2021, SpO2 100 %. Gen: In NAD, laying in bed CV: CTAB, RRR Abd: Gravid, S<D c/w known IUGR GU deferred MSK: neg calf TTP BL Psych/neuro: WNL    Disposition: Discharge disposition: 01-Home or Self Care       Discharge Instructions     Discharge activity:  No Restrictions   Complete by: As directed    Discharge diet:  No restrictions   Complete by: As directed    Fetal Kick Count:  Lie on our left side for one hour after a meal, and count the number of times your baby kicks.  If it is less than 5 times, get up, move around and drink some juice.  Repeat the test 30 minutes later.  If it is still less than 5 kicks in an hour, notify your doctor.   Complete by: As directed    Notify physician for a general feeling that "something is not right"   Complete by: As directed    Notify physician for increase or change in vaginal discharge   Complete by: As directed    Notify physician for intestinal cramps, with or without diarrhea, sometimes described as "gas pain"   Complete by: As directed    Notify  physician for leaking of fluid   Complete by: As directed    Notify physician for low, dull backache, unrelieved by heat or Tylenol   Complete by: As directed    Notify physician for menstrual like cramps   Complete by: As directed    Notify physician for pelvic pressure   Complete by: As directed    Notify physician for uterine contractions.  These may be painless and feel like the uterus is tightening or the baby is  "balling up"   Complete by: As directed    Notify physician for vaginal bleeding   Complete by: As directed    PRETERM LABOR:  Includes any of the follwing symptoms that occur between 20 - [redacted] weeks gestation.  If these symptoms are not stopped, preterm labor can result in preterm delivery, placing your baby at risk   Complete by: As directed       Allergies as of 08/25/2022   No Known Allergies      Medication List     TAKE these medications    aspirin EC 81 MG tablet Take 81 mg by mouth daily. Swallow whole.   prenatal multivitamin Tabs tablet Take 1 tablet by mouth daily at 12 noon.         Signed: Melida Quitter Weslee Fogg 08/25/2022, 1:45 PM

## 2022-08-31 ENCOUNTER — Other Ambulatory Visit: Payer: Self-pay | Admitting: *Deleted

## 2022-08-31 ENCOUNTER — Ambulatory Visit: Payer: BC Managed Care – PPO | Admitting: *Deleted

## 2022-08-31 ENCOUNTER — Inpatient Hospital Stay (HOSPITAL_BASED_OUTPATIENT_CLINIC_OR_DEPARTMENT_OTHER): Payer: BC Managed Care – PPO

## 2022-08-31 ENCOUNTER — Encounter (HOSPITAL_COMMUNITY): Payer: Self-pay | Admitting: Obstetrics and Gynecology

## 2022-08-31 ENCOUNTER — Other Ambulatory Visit: Payer: Self-pay | Admitting: Maternal & Fetal Medicine

## 2022-08-31 ENCOUNTER — Ambulatory Visit (HOSPITAL_BASED_OUTPATIENT_CLINIC_OR_DEPARTMENT_OTHER): Payer: BC Managed Care – PPO

## 2022-08-31 ENCOUNTER — Inpatient Hospital Stay (HOSPITAL_COMMUNITY)
Admission: AD | Admit: 2022-08-31 | Discharge: 2022-08-31 | Disposition: A | Discharge status: Home / Self Care | Payer: BC Managed Care – PPO | Attending: Obstetrics and Gynecology | Admitting: Obstetrics and Gynecology

## 2022-08-31 VITALS — BP 123/70 | HR 82

## 2022-08-31 DIAGNOSIS — O365990 Maternal care for other known or suspected poor fetal growth, unspecified trimester, not applicable or unspecified: Secondary | ICD-10-CM

## 2022-08-31 DIAGNOSIS — O289 Unspecified abnormal findings on antenatal screening of mother: Secondary | ICD-10-CM | POA: Diagnosis present

## 2022-08-31 DIAGNOSIS — O35BXX0 Maternal care for other (suspected) fetal abnormality and damage, fetal cardiac anomalies, not applicable or unspecified: Secondary | ICD-10-CM

## 2022-08-31 DIAGNOSIS — O365930 Maternal care for other known or suspected poor fetal growth, third trimester, not applicable or unspecified: Secondary | ICD-10-CM

## 2022-08-31 DIAGNOSIS — Z3A35 35 weeks gestation of pregnancy: Secondary | ICD-10-CM

## 2022-08-31 DIAGNOSIS — O288 Other abnormal findings on antenatal screening of mother: Secondary | ICD-10-CM | POA: Diagnosis not present

## 2022-08-31 DIAGNOSIS — O358XX0 Maternal care for other (suspected) fetal abnormality and damage, not applicable or unspecified: Secondary | ICD-10-CM | POA: Diagnosis not present

## 2022-08-31 DIAGNOSIS — Z3689 Encounter for other specified antenatal screening: Secondary | ICD-10-CM

## 2022-08-31 DIAGNOSIS — O368130 Decreased fetal movements, third trimester, not applicable or unspecified: Secondary | ICD-10-CM | POA: Diagnosis not present

## 2022-08-31 NOTE — MAU Note (Signed)
Natasha Cooper is a 22 y.o. at 30w1dhere in MAU reporting: had a dr's appt this morning.  They had her on the monitor , noted decreased movement and contractions.  So dr sent her for monitoring and cx exam.  No bleeding or LOF.  Onset of complaint: couple wk Pain score: mod cramping Vitals:   08/31/22 1108  BP: 121/66  Pulse: 94  Resp: 18  Temp: 98.5 F (36.9 C)  SpO2: 97%     FHT:132 Lab orders placed from triage:

## 2022-08-31 NOTE — MAU Provider Note (Signed)
Event Date/Time   First Provider Initiated Contact with Patient 08/31/22 1142     S [name] is a 22 y.o. G1P0 pregnant female at 46w1dwho presents to MAU today per MFM for extended monitoring after BPP of 6/10, pt notes decreased fetal movement (this has been an ongoing problem for her). MFM also reports noting contractions q3-531m on the monitor. Pt endorses occasional mild cramping but is not perceiving it as contractions. Being followed for FGR, has follow up tomorrow. Dr ScEpimenio Sarinith MFM requested repeat BPP in MAU.   [facility].  Pertinent items noted in HPI and remainder of comprehensive ROS otherwise negative.   O BP 119/63   Pulse 87   Temp 98.5 F (36.9 C) (Oral)   Resp 18   Ht '5\' 4"'$  (1.626 m)   Wt 179 lb 3.2 oz (81.3 kg)   LMP 12/22/2021   SpO2 97%   BMI 30.76 kg/m   Constitutional: Well-developed, well-nourished female in no physical distress.  Cardiovascular: normal rate & rhythm, warm and well perfused Respiratory: normal rate and effort, no problems with respiration noted GI: Abd soft, non-tender MS: Extremities nontender, no edema, normal ROM Neurologic: Alert and oriented x 4.  Pelvic: exam deferred, only having very occasional mild contractions on the monitor and not painful to patient  Fetal Tracing: reactive Baseline: 135 Variability: moderate Accelerations: 15x15 Decelerations: none Toco: occasional, some UI  Repeat BPP: 8/8   MDM: Low  MAU Course: Report taken from Dr. ScEpimenio SarinNST reactive throughout MAU visit, BPP normal and pt not feeling contractions.  A& P: Decreased fetal movements in third trimester, single or unspecified fetus - Plan: Discharge patient  Pregnancy affected by fetal growth restriction - Plan: Discharge patient  NST (non-stress test) reactive  [redacted] weeks gestation of pregnancy   P Discharge from MAU in stable condition with return  precautions [facility] and tomorrow for repeat BPP at MFM  Allergies as of 08/31/2022   No Known Allergies      Medication List     TAKE these medications    aspirin EC 81 MG tablet Take 81 mg by mouth daily. Swallow whole.   prenatal multivitamin Tabs tablet Take 1 tablet by mouth daily at 12 noon.       WaGabriel CarinaCNNorth [place]/20/2024 7:56 PM

## 2022-08-31 NOTE — Procedures (Signed)
Natasha Cooper [birth year] [redacted]w[redacted]d Fetus A Non-Stress Test Interpretation for 08/31/22  Indication: IUGR and Unsatisfactory BPP  Fetal Heart Rate A Mode: External Baseline Rate (A): 135 bpm Variability: Moderate Accelerations: 15 x 15 Decelerations: None Multiple birth?: No  Uterine Activity Mode: Palpation, Toco Contraction Frequency (min): 2-5 w/UI Contraction Duration (sec): 20-50 Contraction Quality: Mild Resting Tone Palpated: Relaxed Resting Time: Adequate  Interpretation (Fetal Testing) Nonstress Test Interpretation: Reactive Comments: Dr. SEpimenio Sarinreviewed tracing.

## 2022-09-01 ENCOUNTER — Other Ambulatory Visit: Payer: BC Managed Care – PPO

## 2022-09-01 ENCOUNTER — Ambulatory Visit: Payer: BC Managed Care – PPO | Admitting: *Deleted

## 2022-09-01 ENCOUNTER — Ambulatory Visit: Payer: BC Managed Care – PPO

## 2022-09-01 ENCOUNTER — Ambulatory Visit (HOSPITAL_BASED_OUTPATIENT_CLINIC_OR_DEPARTMENT_OTHER): Payer: BC Managed Care – PPO

## 2022-09-01 ENCOUNTER — Other Ambulatory Visit: Payer: Self-pay | Admitting: Maternal & Fetal Medicine

## 2022-09-01 VITALS — BP 114/61 | HR 84

## 2022-09-01 DIAGNOSIS — Z3A35 35 weeks gestation of pregnancy: Secondary | ICD-10-CM

## 2022-09-01 DIAGNOSIS — O365930 Maternal care for other known or suspected poor fetal growth, third trimester, not applicable or unspecified: Secondary | ICD-10-CM

## 2022-09-01 DIAGNOSIS — O288 Other abnormal findings on antenatal screening of mother: Secondary | ICD-10-CM

## 2022-09-01 DIAGNOSIS — O283 Abnormal ultrasonic finding on antenatal screening of mother: Secondary | ICD-10-CM

## 2022-09-01 DIAGNOSIS — O35BXX0 Maternal care for other (suspected) fetal abnormality and damage, fetal cardiac anomalies, not applicable or unspecified: Secondary | ICD-10-CM

## 2022-09-01 NOTE — Procedures (Cosign Needed Addendum)
Natasha Cooper [birth year] [redacted]w[redacted]d Fetus A Non-Stress Test Interpretation for 09/01/22  Indication: IUGR  Fetal Heart Rate A Mode: External Baseline Rate (A): 135 bpm Variability: Moderate Accelerations: 15 x 15 Decelerations: None Multiple birth?: No  Uterine Activity Mode: Palpation, Toco Contraction Frequency (min): UI Contraction Quality: Mild Resting Tone Palpated: Relaxed Resting Time: Adequate  Interpretation (Fetal Testing) Nonstress Test Interpretation: Reactive Comments: Dr. SEpimenio Sarinreviewed tracing.

## 2022-09-04 LAB — OB RESULTS CONSOLE GBS: GBS: POSITIVE

## 2022-09-07 ENCOUNTER — Ambulatory Visit (HOSPITAL_BASED_OUTPATIENT_CLINIC_OR_DEPARTMENT_OTHER): Payer: BC Managed Care – PPO

## 2022-09-07 ENCOUNTER — Encounter (HOSPITAL_COMMUNITY): Payer: Self-pay

## 2022-09-07 ENCOUNTER — Ambulatory Visit: Payer: BC Managed Care – PPO | Admitting: *Deleted

## 2022-09-07 ENCOUNTER — Telehealth (HOSPITAL_COMMUNITY): Payer: Self-pay | Admitting: *Deleted

## 2022-09-07 VITALS — BP 128/68 | HR 99

## 2022-09-07 DIAGNOSIS — O365930 Maternal care for other known or suspected poor fetal growth, third trimester, not applicable or unspecified: Secondary | ICD-10-CM

## 2022-09-07 DIAGNOSIS — O35BXX0 Maternal care for other (suspected) fetal abnormality and damage, fetal cardiac anomalies, not applicable or unspecified: Secondary | ICD-10-CM | POA: Diagnosis not present

## 2022-09-07 DIAGNOSIS — Z3A36 36 weeks gestation of pregnancy: Secondary | ICD-10-CM | POA: Diagnosis not present

## 2022-09-07 NOTE — Telephone Encounter (Signed)
Preadmission screen  

## 2022-09-08 ENCOUNTER — Telehealth (HOSPITAL_COMMUNITY): Payer: Self-pay | Admitting: *Deleted

## 2022-09-08 ENCOUNTER — Encounter (HOSPITAL_COMMUNITY): Payer: Self-pay | Admitting: *Deleted

## 2022-09-08 NOTE — Telephone Encounter (Signed)
Preadmission screen  

## 2022-09-10 ENCOUNTER — Inpatient Hospital Stay (HOSPITAL_COMMUNITY): Payer: BC Managed Care – PPO

## 2022-09-10 ENCOUNTER — Inpatient Hospital Stay (HOSPITAL_COMMUNITY): Payer: BC Managed Care – PPO | Admitting: Anesthesiology

## 2022-09-10 ENCOUNTER — Inpatient Hospital Stay (HOSPITAL_COMMUNITY)
Admission: RE | Admit: 2022-09-10 | Discharge: 2022-09-13 | DRG: 807 | Disposition: A | Discharge status: Home / Self Care | Payer: BC Managed Care – PPO | Attending: Obstetrics and Gynecology | Admitting: Obstetrics and Gynecology

## 2022-09-10 ENCOUNTER — Encounter (HOSPITAL_COMMUNITY): Payer: Self-pay | Admitting: Obstetrics and Gynecology

## 2022-09-10 ENCOUNTER — Other Ambulatory Visit: Payer: Self-pay

## 2022-09-10 DIAGNOSIS — O365930 Maternal care for other known or suspected poor fetal growth, third trimester, not applicable or unspecified: Secondary | ICD-10-CM | POA: Diagnosis present

## 2022-09-10 DIAGNOSIS — J111 Influenza due to unidentified influenza virus with other respiratory manifestations: Secondary | ICD-10-CM | POA: Diagnosis present

## 2022-09-10 DIAGNOSIS — O9952 Diseases of the respiratory system complicating childbirth: Secondary | ICD-10-CM | POA: Diagnosis present

## 2022-09-10 DIAGNOSIS — Z3A36 36 weeks gestation of pregnancy: Secondary | ICD-10-CM

## 2022-09-10 DIAGNOSIS — Z3490 Encounter for supervision of normal pregnancy, unspecified, unspecified trimester: Principal | ICD-10-CM

## 2022-09-10 DIAGNOSIS — Z20822 Contact with and (suspected) exposure to covid-19: Secondary | ICD-10-CM | POA: Diagnosis present

## 2022-09-10 DIAGNOSIS — O99824 Streptococcus B carrier state complicating childbirth: Secondary | ICD-10-CM | POA: Diagnosis present

## 2022-09-10 LAB — CBC
HCT: 29.6 % — ABNORMAL LOW (ref 36.0–46.0)
Hemoglobin: 10 g/dL — ABNORMAL LOW (ref 12.0–15.0)
MCH: 30.2 pg (ref 26.0–34.0)
MCHC: 33.8 g/dL (ref 30.0–36.0)
MCV: 89.4 fL (ref 80.0–100.0)
Platelets: 257 10*3/uL (ref 150–400)
RBC: 3.31 MIL/uL — ABNORMAL LOW (ref 3.87–5.11)
RDW: 13.4 % (ref 11.5–15.5)
WBC: 6.3 10*3/uL (ref 4.0–10.5)
nRBC: 0 % (ref 0.0–0.2)

## 2022-09-10 LAB — TYPE AND SCREEN
ABO/RH(D): O POS
Antibody Screen: NEGATIVE

## 2022-09-10 LAB — RPR: RPR Ser Ql: NONREACTIVE

## 2022-09-10 MED ORDER — LIDOCAINE-EPINEPHRINE (PF) 1.5 %-1:200000 IJ SOLN
INTRAMUSCULAR | Status: DC | PRN
  Administered 2022-09-10: 5 mL via EPIDURAL

## 2022-09-10 MED ORDER — LACTATED RINGERS IV SOLN
500.0000 mL | Freq: Once | INTRAVENOUS | Status: AC
  Administered 2022-09-10: 500 mL via INTRAVENOUS

## 2022-09-10 MED ORDER — LACTATED RINGERS IV SOLN
500.0000 mL | INTRAVENOUS | Status: DC | PRN
  Administered 2022-09-10: 1000 mL via INTRAVENOUS

## 2022-09-10 MED ORDER — GUAIFENESIN ER 600 MG PO TB12
600.0000 mg | ORAL_TABLET | Freq: Two times a day (BID) | ORAL | Status: DC
  Administered 2022-09-10 (×2): 600 mg via ORAL
  Filled 2022-09-10 (×3): qty 1

## 2022-09-10 MED ORDER — SODIUM CHLORIDE 0.9 % IV SOLN
5.0000 10*6.[IU] | Freq: Once | INTRAVENOUS | Status: AC
  Administered 2022-09-10: 5 10*6.[IU] via INTRAVENOUS
  Filled 2022-09-10: qty 5

## 2022-09-10 MED ORDER — OXYCODONE-ACETAMINOPHEN 5-325 MG PO TABS: 1.0000 | ORAL_TABLET | ORAL | Status: DC | PRN

## 2022-09-10 MED ORDER — TERBUTALINE SULFATE 1 MG/ML IJ SOLN: 0.2500 mg | Freq: Once | INTRAMUSCULAR | Status: DC | PRN

## 2022-09-10 MED ORDER — ACETAMINOPHEN 325 MG PO TABS: 650.0000 mg | ORAL_TABLET | ORAL | Status: DC | PRN

## 2022-09-10 MED ORDER — LACTATED RINGERS IV SOLN: INTRAVENOUS | Status: DC

## 2022-09-10 MED ORDER — OXYTOCIN-SODIUM CHLORIDE 30-0.9 UT/500ML-% IV SOLN: 1.0000 m[IU]/min | INTRAVENOUS | Status: DC

## 2022-09-10 MED ORDER — DIPHENHYDRAMINE HCL 50 MG/ML IJ SOLN: 12.5000 mg | INTRAMUSCULAR | Status: DC | PRN

## 2022-09-10 MED ORDER — EPHEDRINE 5 MG/ML INJ: 10.0000 mg | INTRAVENOUS | Status: DC | PRN

## 2022-09-10 MED ORDER — ONDANSETRON HCL 4 MG/2ML IJ SOLN: 4.0000 mg | Freq: Four times a day (QID) | INTRAMUSCULAR | Status: DC | PRN

## 2022-09-10 MED ORDER — OXYTOCIN-SODIUM CHLORIDE 30-0.9 UT/500ML-% IV SOLN: 2.5000 [IU]/h | INTRAVENOUS | Status: DC

## 2022-09-10 MED ORDER — PENICILLIN G POT IN DEXTROSE 60000 UNIT/ML IV SOLN
3.0000 10*6.[IU] | INTRAVENOUS | Status: DC
  Administered 2022-09-10 – 2022-09-11 (×4): 3 10*6.[IU] via INTRAVENOUS
  Filled 2022-09-10 (×4): qty 50

## 2022-09-10 MED ORDER — FENTANYL-BUPIVACAINE-NACL 0.5-0.125-0.9 MG/250ML-% EP SOLN
12.0000 mL/h | EPIDURAL | Status: DC | PRN
  Administered 2022-09-10: 12 mL/h via EPIDURAL
  Filled 2022-09-10: qty 250

## 2022-09-10 MED ORDER — MISOPROSTOL 25 MCG QUARTER TABLET
25.0000 ug | ORAL_TABLET | ORAL | Status: DC | PRN
  Administered 2022-09-10 (×2): 25 ug via VAGINAL
  Filled 2022-09-10 (×2): qty 1

## 2022-09-10 MED ORDER — OXYTOCIN-SODIUM CHLORIDE 30-0.9 UT/500ML-% IV SOLN
1.0000 m[IU]/min | INTRAVENOUS | Status: DC
  Administered 2022-09-10: 1 m[IU]/min via INTRAVENOUS
  Filled 2022-09-10: qty 500

## 2022-09-10 MED ORDER — PHENYLEPHRINE 80 MCG/ML (10ML) SYRINGE FOR IV PUSH (FOR BLOOD PRESSURE SUPPORT): 80.0000 ug | PREFILLED_SYRINGE | INTRAVENOUS | Status: DC | PRN

## 2022-09-10 MED ORDER — SOD CITRATE-CITRIC ACID 500-334 MG/5ML PO SOLN: 30.0000 mL | ORAL | Status: DC | PRN

## 2022-09-10 MED ORDER — FENTANYL CITRATE (PF) 100 MCG/2ML IJ SOLN
100.0000 ug | INTRAMUSCULAR | Status: DC | PRN
  Administered 2022-09-10: 100 ug via INTRAVENOUS
  Filled 2022-09-10: qty 2

## 2022-09-10 MED ORDER — LIDOCAINE HCL (PF) 1 % IJ SOLN: 30.0000 mL | INTRAMUSCULAR | Status: DC | PRN

## 2022-09-10 MED ORDER — OXYCODONE-ACETAMINOPHEN 5-325 MG PO TABS: 2.0000 | ORAL_TABLET | ORAL | Status: DC | PRN

## 2022-09-10 MED ORDER — OXYTOCIN BOLUS FROM INFUSION
333.0000 mL | Freq: Once | INTRAVENOUS | Status: AC
  Administered 2022-09-11: 333 mL via INTRAVENOUS

## 2022-09-10 NOTE — Anesthesia Procedure Notes (Signed)
Epidural Patient location during procedure: OB Start time: 09/10/2022 5:30 PM End time: 09/10/2022 5:36 PM  Staffing Anesthesiologist: Darral Dash, DO Performed: anesthesiologist   Preanesthetic Checklist Completed: patient identified, IV checked, site marked, risks and benefits discussed, surgical consent, monitors and equipment checked, pre-op evaluation and timeout performed  Epidural Patient position: sitting Prep: ChloraPrep Patient monitoring: heart rate, continuous pulse ox and blood pressure Approach: midline Location: L3-L4 Injection technique: LOR saline  Needle:  Needle type: Tuohy  Needle gauge: 17 G Needle length: 9 cm Needle insertion depth: 6 cm Catheter type: closed end flexible Catheter size: 20 Guage Catheter at skin depth: 11 cm Test dose: negative and 1.5% lidocaine with Epi 1:200 K  Assessment Events: blood not aspirated, no cerebrospinal fluid, injection not painful, no injection resistance and no paresthesia  Additional Notes  Patient identified. Risks/Benefits/Options discussed with patient including but not limited to bleeding, infection, nerve damage, paralysis, failed block, incomplete pain control, headache, blood pressure changes, nausea, vomiting, reactions to medications, itching and postpartum back pain. Confirmed with bedside nurse the patient's most recent platelet count. Confirmed with patient that they are not currently taking any anticoagulation, have any bleeding history or any family history of bleeding disorders. Patient expressed understanding and wished to proceed. All questions were answered. Sterile technique was used throughout the entire procedure. Please see nursing notes for vital signs. Test dose was given through epidural catheter and negative prior to continuing to dose epidural or start infusion. Warning signs of high block given to the patient including shortness of breath, tingling/numbness in hands, complete motor block,  or any concerning symptoms with instructions to call for help. Patient was given instructions on fall risk and not to get out of bed. All questions and concerns addressed with instructions to call with any issues or inadequate analgesia.    Reason for block:procedure for pain

## 2022-09-10 NOTE — H&P (Signed)
Natasha Cooper is a 22 y.o. female presenting for Induction  G1P0 at 36+4 weeks by 14-week ultrasound presents for induction of labor for intrauterine growth restriction. She is dated by a 14 week [place] = LMP, however was measuring 42w1don initial scan with LMP dating of 111w0dt that initial [place] dating guidelines, this was < 7d difference so EDD established by LMP.  At patient's anatomy scan was measuring 188w6d20+1 with AC at 5% and FL/HC <4%.  Patient was referred to maternal-fetal medicine.  MFM recommended changing patient's EDD to 10/04/2022.  Estimated fetal weight with changed EDC by MFM was 11th percentile.  Follow-up ultrasound with MFM 2 weeks later showed EFW 12 percentile with elevated pulm artery Dopplers.  Patient has subsequently been followed by maternal-fetal medicine closely with during this pregnancy.  MFM recommended induction of labor between 36 to 37 weeks due to growth restriction. EFW at 34 weeks was 1918 gm (4#4) 5%.  BPP at that time was 6 out of 10.  Patient was admitted for continuous monitoring and antenatal steroids.  Follow-up BPP was 10 out of 10 and patient was discharged home OB History     Gravida  1   Para      Term      Preterm      AB      Living         SAB      IAB      Ectopic      Multiple      Live Births             Past Medical History:  Diagnosis Date   Constipation    Medical history non-contributory    Past Surgical History:  Procedure Laterality Date   HERNIA REPAIR     Family History: family history includes Arthritis in her maternal grandmother; Asthma in her brother; Cancer in her paternal grandmother; Diabetes in her maternal grandmother; Multiple sclerosis in her father. Social History:  reports that she has never smoked. She has never been exposed to tobacco smoke. She has never used smokeless tobacco. She reports that she does not drink alcohol and does not use drugs.     Maternal Diabetes:  No Genetic Screening: Normal Maternal Ultrasounds/Referrals: Other: Fetal Ultrasounds or other Referrals:  Referred to Materal Fetal Medicine as noted above Maternal Substance Abuse:  No Significant Maternal Medications:  None Significant Maternal Lab Results:  Group B Strep positive Number of Prenatal Visits:greater than 3 verified prenatal visits Other Comments:  None  Review of Systems History Dilation: 1 Effacement (%): 60, 70 Station: -2 Exam by:: C. GArchie BalboaNC Blood pressure 102/63, pulse 92, temperature 98.8 F (37.1 C), temperature source Oral, resp. rate 16, height '5\' 4"'$  (1.626 m), weight 80.5 kg, last menstrual period 12/22/2021. Exam Physical Exam  AOx3, NAD Gravid, Soft FHR 120 reactive, cat 1 tracing  Prenatal labs: ABO, Rh: --/--/O POS (02/29 0650) Antibody: NEG (02/29 0650) Rubella: Immune, Immune (09/18 0000) RPR: Nonreactive, Nonreactive (09/18 0000)  HBsAg: Negative, Negative (09/18 0000)  HIV: Non-reactive, Non-reactive (09/18 0000)  GBS: Positive/-- (02/23 0000)   Assessment/Plan: 1) Admit 2) S/P BMZ 2/12 & 2/13 3) misoprostal 48m44mV Q 4 hrs 4) PCN for GBS 5) Epidural on request    KendVanessa Kick9/2024, 9:27 AM

## 2022-09-10 NOTE — Anesthesia Preprocedure Evaluation (Signed)
Anesthesia Evaluation  Patient identified by MRN, date of birth, ID band Patient awake    Reviewed: Allergy & Precautions, NPO status , Patient's Chart, lab work & pertinent test results  Airway Mallampati: II  TM Distance: >3 FB Neck ROM: Full    Dental no notable dental hx.    Pulmonary neg pulmonary ROS   Pulmonary exam normal        Cardiovascular negative cardio ROS  Rhythm:Regular Rate:Normal     Neuro/Psych negative neurological ROS  negative psych ROS   GI/Hepatic negative GI ROS, Neg liver ROS,,,  Endo/Other  negative endocrine ROS    Renal/GU negative Renal ROS  negative genitourinary   Musculoskeletal   Abdominal Normal abdominal exam  (+)   Peds  Hematology negative hematology ROS (+)   Anesthesia Other Findings   Reproductive/Obstetrics (+) Pregnancy                             Anesthesia Physical Anesthesia Plan  ASA: 2  Anesthesia Plan: Epidural   Post-op Pain Management: Epidural*   Induction:   PONV Risk Score and Plan: 2 and Treatment may vary due to age or medical condition  Airway Management Planned: Natural Airway  Additional Equipment: None  Intra-op Plan:   Post-operative Plan:   Informed Consent: I have reviewed the patients History and Physical, chart, labs and discussed the procedure including the risks, benefits and alternatives for the proposed anesthesia with the patient or authorized representative who has indicated his/her understanding and acceptance.       Plan Discussed with:   Anesthesia Plan Comments:        Anesthesia Quick Evaluation

## 2022-09-11 ENCOUNTER — Encounter (HOSPITAL_COMMUNITY): Payer: Self-pay | Admitting: Obstetrics and Gynecology

## 2022-09-11 LAB — CBC
HCT: 25.9 % — ABNORMAL LOW (ref 36.0–46.0)
Hemoglobin: 9 g/dL — ABNORMAL LOW (ref 12.0–15.0)
MCH: 30.8 pg (ref 26.0–34.0)
MCHC: 34.7 g/dL (ref 30.0–36.0)
MCV: 88.7 fL (ref 80.0–100.0)
Platelets: 211 10*3/uL (ref 150–400)
RBC: 2.92 MIL/uL — ABNORMAL LOW (ref 3.87–5.11)
RDW: 13.4 % (ref 11.5–15.5)
WBC: 11.5 10*3/uL — ABNORMAL HIGH (ref 4.0–10.5)
nRBC: 0 % (ref 0.0–0.2)

## 2022-09-11 LAB — RESP PANEL BY RT-PCR (RSV, FLU A&B, COVID)  RVPGX2
Influenza A by PCR: POSITIVE — AB
Influenza B by PCR: NEGATIVE
Resp Syncytial Virus by PCR: NEGATIVE
SARS Coronavirus 2 by RT PCR: NEGATIVE

## 2022-09-11 MED ORDER — METHYLERGONOVINE MALEATE 0.2 MG PO TABS: 0.2000 mg | ORAL_TABLET | ORAL | Status: DC | PRN

## 2022-09-11 MED ORDER — ONDANSETRON HCL 4 MG/2ML IJ SOLN: 4.0000 mg | INTRAMUSCULAR | Status: DC | PRN

## 2022-09-11 MED ORDER — SENNOSIDES-DOCUSATE SODIUM 8.6-50 MG PO TABS
2.0000 | ORAL_TABLET | Freq: Every day | ORAL | Status: DC
  Administered 2022-09-12 – 2022-09-13 (×2): 2 via ORAL
  Filled 2022-09-11 (×2): qty 2

## 2022-09-11 MED ORDER — ZOLPIDEM TARTRATE 5 MG PO TABS: 5.0000 mg | ORAL_TABLET | Freq: Every evening | ORAL | Status: DC | PRN

## 2022-09-11 MED ORDER — GUAIFENESIN ER 600 MG PO TB12
600.0000 mg | ORAL_TABLET | Freq: Two times a day (BID) | ORAL | Status: DC
  Administered 2022-09-12 – 2022-09-13 (×4): 600 mg via ORAL
  Filled 2022-09-11 (×4): qty 1

## 2022-09-11 MED ORDER — IBUPROFEN 600 MG PO TABS
600.0000 mg | ORAL_TABLET | Freq: Four times a day (QID) | ORAL | Status: DC
  Administered 2022-09-11: 600 mg via ORAL
  Filled 2022-09-11 (×2): qty 1

## 2022-09-11 MED ORDER — DIBUCAINE (PERIANAL) 1 % EX OINT: 1.0000 | TOPICAL_OINTMENT | CUTANEOUS | Status: DC | PRN

## 2022-09-11 MED ORDER — TETANUS-DIPHTH-ACELL PERTUSSIS 5-2.5-18.5 LF-MCG/0.5 IM SUSY: 0.5000 mL | PREFILLED_SYRINGE | Freq: Once | INTRAMUSCULAR | Status: DC

## 2022-09-11 MED ORDER — OSELTAMIVIR PHOSPHATE 75 MG PO CAPS
75.0000 mg | ORAL_CAPSULE | Freq: Two times a day (BID) | ORAL | Status: DC
  Administered 2022-09-12 – 2022-09-13 (×4): 75 mg via ORAL
  Filled 2022-09-11 (×4): qty 1

## 2022-09-11 MED ORDER — ACETAMINOPHEN 325 MG PO TABS
650.0000 mg | ORAL_TABLET | ORAL | Status: DC | PRN
  Administered 2022-09-11: 650 mg via ORAL
  Filled 2022-09-11: qty 2

## 2022-09-11 MED ORDER — OXYCODONE HCL 5 MG PO TABS: 10.0000 mg | ORAL_TABLET | ORAL | Status: DC | PRN

## 2022-09-11 MED ORDER — PRENATAL MULTIVITAMIN CH
1.0000 | ORAL_TABLET | Freq: Every day | ORAL | Status: DC
  Administered 2022-09-12: 1 via ORAL
  Filled 2022-09-11 (×2): qty 1

## 2022-09-11 MED ORDER — BENZOCAINE-MENTHOL 20-0.5 % EX AERO
1.0000 | INHALATION_SPRAY | CUTANEOUS | Status: DC | PRN
  Filled 2022-09-11: qty 56

## 2022-09-11 MED ORDER — SIMETHICONE 80 MG PO CHEW: 80.0000 mg | CHEWABLE_TABLET | ORAL | Status: DC | PRN

## 2022-09-11 MED ORDER — ACETAMINOPHEN 500 MG PO TABS
1000.0000 mg | ORAL_TABLET | Freq: Three times a day (TID) | ORAL | Status: DC
  Administered 2022-09-11 – 2022-09-13 (×7): 1000 mg via ORAL
  Filled 2022-09-11 (×7): qty 2

## 2022-09-11 MED ORDER — OXYTOCIN-SODIUM CHLORIDE 30-0.9 UT/500ML-% IV SOLN: 2.5000 [IU]/h | INTRAVENOUS | Status: DC | PRN

## 2022-09-11 MED ORDER — ONDANSETRON HCL 4 MG PO TABS: 4.0000 mg | ORAL_TABLET | ORAL | Status: DC | PRN

## 2022-09-11 MED ORDER — METHYLERGONOVINE MALEATE 0.2 MG/ML IJ SOLN: 0.2000 mg | INTRAMUSCULAR | Status: DC | PRN

## 2022-09-11 MED ORDER — DIPHENHYDRAMINE HCL 25 MG PO CAPS: 25.0000 mg | ORAL_CAPSULE | Freq: Four times a day (QID) | ORAL | Status: DC | PRN

## 2022-09-11 MED ORDER — OXYCODONE HCL 5 MG PO TABS: 5.0000 mg | ORAL_TABLET | ORAL | Status: DC | PRN

## 2022-09-11 MED ORDER — COCONUT OIL OIL: 1.0000 | TOPICAL_OIL | Status: DC | PRN

## 2022-09-11 MED ORDER — LACTATED RINGERS IV SOLN: INTRAVENOUS | Status: DC

## 2022-09-11 MED ORDER — WITCH HAZEL-GLYCERIN EX PADS: 1.0000 | MEDICATED_PAD | CUTANEOUS | Status: DC | PRN

## 2022-09-11 NOTE — Lactation Note (Signed)
This note was copied from a baby's chart. Lactation Consultation Note  Patient Name: Girl Wilda Altice [id number] Date: 09/11/2022 Age:24 hours   Birth parent's choice is to formula feed.  Maternal Data    Feeding    LATCH Score                    Lactation Tools Discussed/Used    Interventions    Discharge    Consult Status Consult Status: Complete    Elly Modena Lizann Edelman 09/11/2022, 8:21 AM

## 2022-09-11 NOTE — Progress Notes (Signed)
Respiratory panel returns positive for Flu A. Tamiflu '75mg'$  BID ordered as well as droplet precautions BP 103/73   Pulse 76   Temp 97.6 F (36.4 C) (Oral)   Resp 16   Ht '5\' 4"'$  (1.626 m)   Wt 80.5 kg   LMP 12/22/2021   SpO2 100%   Breastfeeding Unknown   BMI 30.46 kg/m

## 2022-09-11 NOTE — Progress Notes (Signed)
POSTPARTUM PROGRESS NOTE  Post Partum Day #1  Subjective:  No acute events overnight.  Pt denies problems with ambulating, voiding or po intake.  She denies nausea or vomiting.  Pain is well controlled.   Lochia Minimal. Bottle feeding baby girl, currently under warmer for temp maintenance   Objective: Blood pressure (!) 113/53, pulse 100, temperature 99.4 F (37.4 C), temperature source Oral, resp. rate 18, height '5\' 4"'$  (1.626 m), weight 80.5 kg, last menstrual period 12/22/2021, SpO2 99 %, unknown if currently breastfeeding.  Physical Exam:  General: alert, cooperative and no distress Lochia:normal flow Chest: CTAB Heart: RRR no m/r/g Abdomen: +BS, soft, nontender Uterine Fundus: firm, 2cm below umbilicus GU: suture intact, healing well, no purulent drainage Extremities: neg edema, neg calf TTP BL, neg Homans BL  Recent Labs    09/10/22 0650 09/11/22 0607  HGB 10.0* 9.0*  HCT 29.6* 25.9*    Assessment/Plan:  ASSESSMENT: Natasha Cooper is a 22 y.o. G1P0101 s/p SVD @ [redacted]w[redacted]d PNC c/b IUGR.   Plan for discharge tomorrow   LOS: 1 day

## 2022-09-11 NOTE — Anesthesia Postprocedure Evaluation (Signed)
Anesthesia Post Note  Patient: Natasha Cooper  Procedure(s) Performed: AN AD [place]     Patient location during evaluation: Mother Baby Anesthesia Type: Epidural Level of consciousness: awake and alert and oriented Pain management: satisfactory to patient Vital Signs Assessment: post-procedure vital signs reviewed and stable Respiratory status: respiratory function stable Cardiovascular status: stable Postop Assessment: no headache, no backache, epidural receding, patient able to bend at knees, no signs of nausea or vomiting, adequate PO intake and able to ambulate Anesthetic complications: no   No notable events documented.  Last Vitals:  Vitals:   09/11/22 0638 09/11/22 0855  BP: (!) 113/53 (!) 104/52  Pulse: 100 81  Resp: 18 18  Temp: 37.4 C 36.7 C  SpO2: 99% 98%    Last Pain:  Vitals:   09/11/22 0855  TempSrc: Oral  PainSc: 3    Pain Goal:                   Llewellyn Choplin

## 2022-09-12 MED ORDER — SALINE SPRAY 0.65 % NA SOLN
2.0000 | NASAL | Status: DC | PRN
  Administered 2022-09-12: 2 via NASAL
  Filled 2022-09-12: qty 44

## 2022-09-12 MED ORDER — IBUPROFEN 600 MG PO TABS
600.0000 mg | ORAL_TABLET | Freq: Four times a day (QID) | ORAL | Status: DC
  Administered 2022-09-12 – 2022-09-13 (×5): 600 mg via ORAL
  Filled 2022-09-12 (×5): qty 1

## 2022-09-12 MED ORDER — PSEUDOEPHEDRINE HCL 30 MG PO TABS
60.0000 mg | ORAL_TABLET | Freq: Four times a day (QID) | ORAL | Status: DC | PRN
  Filled 2022-09-12: qty 1

## 2022-09-12 NOTE — Progress Notes (Signed)
Patient is doing well.  She is ambulating, voiding, tolerating PO.  C/o cramping--has only taken tylenol.  Lochia is appropriate.  She tested positive for flu A yesterday and was started on tamiflu.  Still with nasal congestion  Vitals:   09/11/22 1356 09/11/22 1811 09/11/22 2028 09/12/22 0600  BP: 101/62 107/75 103/73 95/66  Pulse: 80 80 76 65  Resp: '18 18 16 16  '$ Temp: 97.7 F (36.5 C) 98.2 F (36.8 C) 97.6 F (36.4 C) 97.7 F (36.5 C)  TempSrc: Oral Oral Oral Oral  SpO2: 98% 100% 100% 98%  Weight:      Height:        NAD Fundus firm Ext: no edema  Lab Results  Component Value Date   WBC 11.5 (H) 09/11/2022   HGB 9.0 (L) 09/11/2022   HCT 25.9 (L) 09/11/2022   MCV 88.7 09/11/2022   PLT 211 09/11/2022    --/--/O POS (02/29 0650)  A/P 22 y.o. G1P0101 PPD#1 s/p SVD at 36 weeks for FGR. Routine care.   Pain control--added scheduled motrin + tylenol--reviewed goals of pain management (unsure why no ibuprofen to date, no allergy) Influenza A--on tamiflu.  Bottle feeding only.  Rx nasal saline spray, sudafed for prn use Expect d/c tomorrow.    [place]

## 2022-09-13 MED ORDER — IBUPROFEN 600 MG PO TABS: 600.0000 mg | ORAL_TABLET | Freq: Four times a day (QID) | ORAL | 0 refills | Status: AC | PRN

## 2022-09-13 NOTE — Discharge Summary (Signed)
Postpartum Discharge Summary       Patient Name: Natasha Cooper DOB: [birth year] MRN: [id number]  Date of admission: 09/10/2022 Delivery date:09/11/2022  Delivering provider: Vanessa Kick  Date of discharge: 09/13/2022  Admitting diagnosis: Pregnancy [Z34.90] Spontaneous vaginal delivery [O80] Intrauterine pregnancy: [redacted]w[redacted]d    Secondary diagnosis:  Principal Problem:   Pregnancy Active Problems:   Spontaneous vaginal delivery  Additional problems: Fetal Growth Restriction    Discharge diagnosis: Preterm Pregnancy Delivered                                              Post partum procedures: NA Augmentation: AROM, Pitocin, Cytotec, and IP Foley Complications: None  Hospital course: Induction of Labor With Vaginal Delivery   22y.o. yo G1P0101 at 320w5das admitted to the hospital 09/10/2022 for induction of labor.  Indication for induction:  IUGR .  Patient had an labor course complicated by nothing Membrane Rupture Time/Date: 6:37 PM ,09/10/2022   Delivery Method:Vaginal, Spontaneous  Episiotomy: None  Lacerations:  2nd degree  Details of delivery can be found in separate delivery note.  Patient had a postpartum course complicated by nothing. Patient is discharged home 09/13/22.  Newborn Data: Birth date:09/11/2022  Birth time:1:39 AM  Gender:Female  Living status:Living  Apgars:8 ,9  Weight:2700 g   Magnesium Sulfate received: No BMZ received: Yes Rhophylac:N/A  Physical exam  Vitals:   09/12/22 0600 09/12/22 1506 09/12/22 2040 09/13/22 0525  BP: 95/66 107/70 105/69 114/77  Pulse: 65 74 65 71  Resp: '16 15 18 18  '$ Temp: 97.7 F (36.5 C) 97.9 F (36.6 C) 98 F (36.7 C) 97.8 F (36.6 C)  TempSrc: Oral Oral Oral Oral  SpO2: 98% 95% 99% 98%  Weight:      Height:       General: alert, cooperative, and no distress Lochia: appropriate Uterine Fundus: firm DVT Evaluation: No evidence of DVT seen on physical exam. Labs: Lab Results  Component Value Date    WBC 11.5 (H) 09/11/2022   HGB 9.0 (L) 09/11/2022   HCT 25.9 (L) 09/11/2022   MCV 88.7 09/11/2022   PLT 211 09/11/2022      Latest Ref Rng & Units 02/16/2017    7:38 AM  CMP  Glucose 65 - 99 mg/dL 138   BUN 6 - 20 mg/dL 10   Creatinine 0.44 - 1.00 mg/dL 0.78   Sodium 135 - 145 mmol/L 137   Potassium 3.5 - 5.1 mmol/L 3.6   Chloride 101 - 111 mmol/L 104   CO2 22 - 32 mmol/L 25   Calcium 8.9 - 10.3 mg/dL 9.4   Total Protein 6.5 - 8.1 g/dL 7.4   Total Bilirubin 0.3 - 1.2 mg/dL 1.0   Alkaline Phos 38 - 126 U/L 65   AST 15 - 41 U/L 27   ALT 14 - 54 U/L 16    Edinburgh Score:    09/12/2022   12:30 PM  Edinburgh Postnatal Depression Scale Screening Tool  I have been able to laugh and see the funny side of things. 0  I have looked forward with enjoyment to things. 0  I have blamed myself unnecessarily when things went wrong. 0  I have been anxious or worried for no good reason. 0  I have felt scared or panicky for no good reason. 0  Things have been  getting on top of me. 0  I have been so unhappy that I have had difficulty sleeping. 0  I have felt sad or miserable. 0  I have been so unhappy that I have been crying. 0  The thought of harming myself has occurred to me. 0  Edinburgh Postnatal Depression Scale Total 0      After visit meds:  Allergies as of 09/13/2022   No Known Allergies      Medication List     STOP taking these medications    aspirin EC 81 MG tablet       TAKE these medications    ibuprofen 600 MG tablet Commonly known as: ADVIL Take 1 tablet (600 mg total) by mouth every 6 (six) hours as needed.   prenatal multivitamin Tabs tablet Take 1 tablet by mouth daily at 12 noon.               Discharge Care Instructions  (From admission, onward)           Start     Ordered   09/13/22 0000  Discharge wound care:       Comments: For a cesarean delivery: You may wash incision with soap and water.  Do not soak or submerge the incision  for 2 weeks. Keep incision dry. You may need to keep a sanitary pad or panty liner between the incision and your clothing for comfort and to keep the incision dry. If you note drainage, increased pain, or increased redness of the incision, then please notify your physician.   09/13/22 1010   09/13/22 0000  If the dressing is still on your incision site when you go home, remove it on the third day after your surgery date. Remove dressing if it begins to fall off, or if it is dirty or damaged before the third day.       Comments: For a cesarean delivery   09/13/22 1010             Discharge home in stable condition Infant Feeding: Bottle and Breast Infant Disposition:home with mother Discharge instruction: per After Visit Summary and Postpartum booklet. Activity: Advance as tolerated. Pelvic rest for 6 weeks.  Diet: routine diet Anticipated Birth Control: Unsure Postpartum Appointment:4 weeks Future Appointments:No future appointments. Follow up Visit:      09/13/2022 Vanessa Kick, MD

## 2022-09-14 LAB — SURGICAL PATHOLOGY

## 2022-09-22 ENCOUNTER — Telehealth (HOSPITAL_COMMUNITY): Payer: Self-pay | Admitting: *Deleted

## 2022-09-22 NOTE — Telephone Encounter (Signed)
Left phone voicemail message.  Odis Hollingshead, RN 09-22-2022 at 3:22pm

## 2022-10-12 ENCOUNTER — Telehealth (HOSPITAL_COMMUNITY): Payer: Self-pay

## 2022-10-12 NOTE — Telephone Encounter (Signed)
Chart review.

## 2023-05-26 ENCOUNTER — Ambulatory Visit: Payer: BC Managed Care – PPO | Admitting: Family Medicine

## 2023-08-02 ENCOUNTER — Ambulatory Visit (INDEPENDENT_AMBULATORY_CARE_PROVIDER_SITE_OTHER): Payer: BC Managed Care – PPO | Admitting: Dermatology

## 2023-08-02 ENCOUNTER — Encounter: Payer: Self-pay | Admitting: Dermatology

## 2023-08-02 VITALS — BP 165/98 | HR 91

## 2023-08-02 DIAGNOSIS — L700 Acne vulgaris: Secondary | ICD-10-CM | POA: Diagnosis not present

## 2023-08-02 MED ORDER — CLINDAMYCIN PHOSPHATE 1 % EX SWAB: 1.0000 "application " | Freq: Every morning | CUTANEOUS | 3 refills | Status: AC

## 2023-08-02 MED ORDER — TRETINOIN 0.025 % EX CREA: TOPICAL_CREAM | Freq: Every day | CUTANEOUS | 3 refills | Status: AC

## 2023-08-02 NOTE — Progress Notes (Signed)
.    New Patient Visit   Subjective  Natasha Cooper is a 23 y.o. 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 She also gets dryness of glabella area and around her nose. She has never been prescribed acne medication and uses Cetaphil to wash.    The following portions of the chart were reviewed this encounter and updated as appropriate: medications, allergies, medical history  Review of Systems:  No other skin or systemic complaints except as noted in HPI or Assessment and Plan.  Objective  Well appearing patient in no apparent distress; mood and affect are within normal limits.  Areas Examined: Face, chest and back  Relevant exam findings are noted in the Assessment and Plan.   Assessment & Plan    ACNE VULGARIS Exam: Open comedones and inflammatory papules             Acne Vulgaris Assessment: Patient presents with postpartum acne characterized by numerous comedones and microcomedones, and a few papules on the cheeks. The acne is described as "more comedonal" and not severe. No history of prescription treatments for acne.  Plan:   Initiate topical regimen:     Morning: Apply Clindamycin swabs after washing face, followed by moisturizer.     Evening: Apply Tretinoin 0.025% cream, three nights per week (Monday, Wednesday, Friday).   Continue using Cetaphil for face washing.   Provide samples of CeraVe or LaRoche-Posay moisturizer.   Follow up in 4 months to assess progress.   Consider oral medication if topical regimen is insufficient after 4 months.   Educate patient on proper application and potential side effects of tretinoin (dryness, irritation).   Instruct on gradual introduction of tretinoin and moisturizing routine.  Coding   Return in about 4 months (around 11/30/2023) for Acne.  I, Joanie Coddington, CMA, am acting as scribe for [employer], DO .   Documentation: I have reviewed the  above documentation for accuracy and completeness, and I agree with the above.  Langston Reusing, DO

## 2023-08-02 NOTE — Patient Instructions (Addendum)
Dear [name],  Thank you for visiting our dermatology clinic today. We are committed to supporting you in your journey towards improved skin health. Below is a detailed summary of the treatment plan we discussed during your consultation:  Morning Routine:   Cleansing: Begin your day by washing your face to remove overnight build-up.   Medication: Apply clindamycin swab to the entire face to help reduce bacteria presence.   Moisturizing: Follow up with a moisturizer to keep your skin hydrated.  Nighttime Routine:   Cleansing: Wash your face with Cetaphil to ensure it's clean and ready for treatment.   Treatment: Apply a pea-sized amount of tretinoin 0.025%, initially on Monday, Wednesday, and Friday nights. If you experience dryness, adjust to two nights a week.   Moisturizing: Apply moisturizer after the tretinoin to mitigate any potential dryness.  General Skincare:   Consistency: Continue to wash and moisturize every night, regardless of whether tretinoin is applied.   Expectations: Anticipate gradual improvements every 4 weeks, with a more significant change expected around the 46-month mark.  Follow-Up:   We have scheduled a follow-up appointment in 4 months to assess your progress and possibly adjust your treatment plan based on the results.  Medications Prescribed:   Morning: Clindamycin 1% swabs.   Night: Tretinoin 0.025% cream.  Recommended Products:   Cleanser: Cetaphil.   Moisturizers: Samples of CeraVe or LaRoche-Posay have been provided for your use.  Please feel free to use MyChart for any questions or to update [place] on your progress. We are eager to see the improvements in your skin and discuss potential enhancements to your regimen during your next visit.  Warm regards,  Dr. [facility]       Topical retinoid medications like tretinoin/Retin-A, adapalene/Differin, tazarotene/Fabior, and Epiduo/Epiduo Forte can cause dryness and irritation  when first started. Only apply a pea-sized amount to the entire affected area. Avoid applying it around the eyes, edges of mouth and creases at the nose. If you experience irritation, use a good moisturizer first and/or apply the medicine less often. If you are doing well with the medicine, you can increase how often you use it until you are applying every night. Be careful with sun protection while using this medication as it can make you sensitive to the sun. This medicine should not be used by pregnant women.     Important Information  Due to recent changes in healthcare laws, you may see results of your pathology and/or laboratory studies on MyChart before the doctors have had a chance to review them. We understand that in some cases there may be results that are confusing or concerning to you. Please understand that not all results are received at the same time and often the doctors may need to interpret multiple results in order to provide you with the best plan of care or course of treatment. Therefore, we ask that you please give [place] 2 business days to thoroughly review all your results before contacting the office for clarification. Should we see a critical lab result, you will be contacted sooner.   If You Need Anything After Your Visit  If you have any questions or concerns for your doctor, please call our main line at [phone] If no one answers, please leave a voicemail as directed and we will return your call as soon as possible. Messages left after 4 pm will be answered the following business day.   You may also send [place] a message via MyChart. We typically  respond to MyChart messages within 1-2 business days.  For prescription refills, please ask your pharmacy to contact our office. Our fax number is [phone].  If you have an urgent issue when the clinic is closed that cannot wait until the next business day, you can page your doctor at the number below.    Please note that  while we do our best to be available for urgent issues outside of office hours, we are not available 24/7.   If you have an urgent issue and are unable to reach [place], you may choose to seek medical care at your doctor's office, retail clinic, urgent care center, or emergency room.  If you have a medical emergency, please immediately call 911 or go to the emergency department. In the event of inclement weather, please call our main line at [phone] for an update on the status of any delays or closures.  Dermatology Medication Tips: Please keep the boxes that topical medications come in in order to help keep track of the instructions about where and how to use these. Pharmacies typically print the medication instructions only on the boxes and not directly on the medication tubes.   If your medication is too expensive, please contact our office at [phone] or send [place] a message through MyChart.   We are unable to tell what your co-pay for medications will be in advance as this is different depending on your insurance coverage. However, we may be able to find a substitute medication at lower cost or fill out paperwork to get insurance to cover a needed medication.   If a prior authorization is required to get your medication covered by your insurance company, please allow [place] 1-2 business days to complete this process.  Drug prices often vary depending on where the prescription is filled and some pharmacies may offer cheaper prices.  The website www.goodrx.com contains coupons for medications through different pharmacies. The prices here do not account for what the cost may be with help from insurance (it may be cheaper with your insurance), but the website can give you the price if you did not use any insurance.  - You can print the associated coupon and take it with your prescription to the pharmacy.  - You may also stop by our office during regular business hours and pick up a GoodRx coupon  card.  - If you need your prescription sent electronically to a different pharmacy, notify our office through [facility] or by phone at [phone]

## 2023-11-30 ENCOUNTER — Ambulatory Visit: Payer: BC Managed Care – PPO | Admitting: Dermatology
